# Patient Record
Sex: Female | Born: 1960 | Race: Black or African American | Hispanic: No | Marital: Married | Smoking: Never smoker
Health system: Southern US, Community
[De-identification: ages and names within clinical notes are randomized; demographics above are authoritative.]

## PROBLEM LIST (undated history)

## (undated) DIAGNOSIS — R102 Pelvic and perineal pain unspecified side: Secondary | ICD-10-CM

## (undated) DIAGNOSIS — B3731 Acute candidiasis of vulva and vagina: Secondary | ICD-10-CM

## (undated) DIAGNOSIS — R9230 Dense breasts, unspecified: Secondary | ICD-10-CM

## (undated) DIAGNOSIS — E079 Disorder of thyroid, unspecified: Secondary | ICD-10-CM

## (undated) DIAGNOSIS — B373 Candidiasis of vulva and vagina: Secondary | ICD-10-CM

## (undated) DIAGNOSIS — I10 Essential (primary) hypertension: Secondary | ICD-10-CM

## (undated) DIAGNOSIS — R922 Inconclusive mammogram: Secondary | ICD-10-CM

## (undated) HISTORY — DX: Dense breasts, unspecified: R92.30

## (undated) HISTORY — DX: Candidiasis of vulva and vagina: B37.3

## (undated) HISTORY — DX: Pelvic and perineal pain: R10.2

## (undated) HISTORY — PX: ABDOMINAL HYSTERECTOMY: SHX81

## (undated) HISTORY — DX: Inconclusive mammogram: R92.2

## (undated) HISTORY — DX: Pelvic and perineal pain unspecified side: R10.20

## (undated) HISTORY — DX: Essential (primary) hypertension: I10

## (undated) HISTORY — DX: Acute candidiasis of vulva and vagina: B37.31

## (undated) HISTORY — DX: Disorder of thyroid, unspecified: E07.9

---

## 2005-09-11 ENCOUNTER — Encounter: Admission: RE | Admit: 2005-09-11 | Discharge: 2005-09-11 | Payer: Self-pay | Admitting: Endocrinology

## 2005-10-03 ENCOUNTER — Encounter: Admission: RE | Admit: 2005-10-03 | Discharge: 2005-10-03 | Payer: Self-pay | Admitting: Endocrinology

## 2006-04-20 ENCOUNTER — Other Ambulatory Visit: Admission: RE | Admit: 2006-04-20 | Discharge: 2006-04-20 | Payer: Self-pay | Admitting: Obstetrics and Gynecology

## 2006-05-14 ENCOUNTER — Encounter: Admission: RE | Admit: 2006-05-14 | Discharge: 2006-05-14 | Payer: Self-pay | Admitting: Obstetrics and Gynecology

## 2006-05-29 ENCOUNTER — Encounter: Admission: RE | Admit: 2006-05-29 | Discharge: 2006-05-29 | Payer: Self-pay | Admitting: Obstetrics and Gynecology

## 2006-12-29 ENCOUNTER — Encounter: Admission: RE | Admit: 2006-12-29 | Discharge: 2006-12-29 | Payer: Self-pay | Admitting: Obstetrics and Gynecology

## 2007-05-19 ENCOUNTER — Encounter: Admission: RE | Admit: 2007-05-19 | Discharge: 2007-05-19 | Payer: Self-pay | Admitting: Obstetrics and Gynecology

## 2007-05-31 ENCOUNTER — Ambulatory Visit (HOSPITAL_COMMUNITY): Admission: RE | Admit: 2007-05-31 | Discharge: 2007-05-31 | Payer: Self-pay | Admitting: Obstetrics and Gynecology

## 2009-08-08 IMAGING — CT CT PELVIS W/ CM
3 of 5 series · 15 of 32 positions shown, 19 images · IV contrast (omnipaque)
Comparison: none

CLINICAL DATA: Left lower quadrant pain for several years.  Uterus and left ovary removed. 
 ABDOMEN CT WITH CONTRAST:
TECHNIQUE: Multidetector CT imaging of the abdomen was performed following the standard protocol during bolus administration of intravenous contrast.
 Contrast:  20   cc Omnipaque 300
TECHNIQUE: Multidetector CT imaging of the pelvis was performed following the standard protocol during bolus administration of intravenous contrast.

[Series 2: abd pelvis · axial · 0.67mm/px · z∈[-282,-102]mm · 3 of 72 slices shown, 7 images]
[im 18/72  soft-tissue]
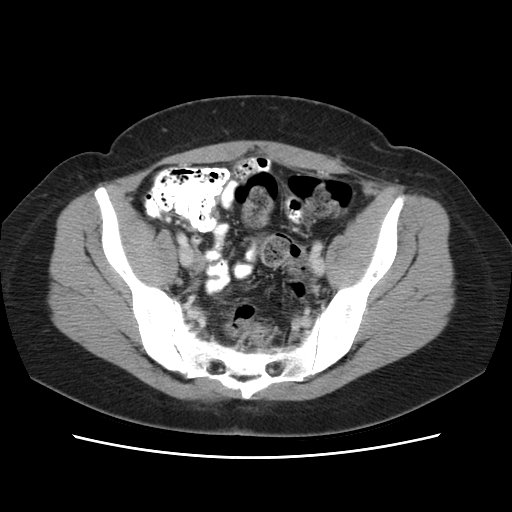
[im 18/72  lung]
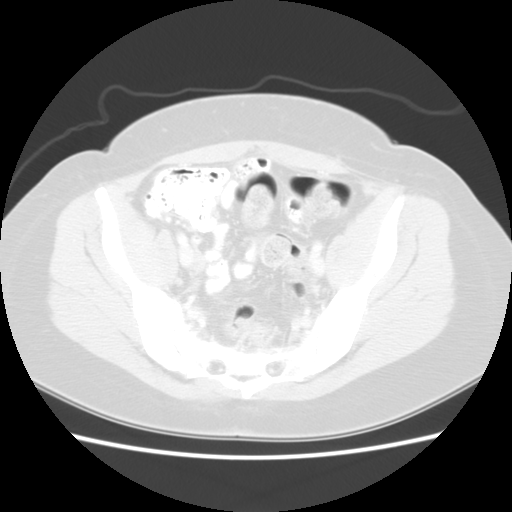
[im 18/72  bone]
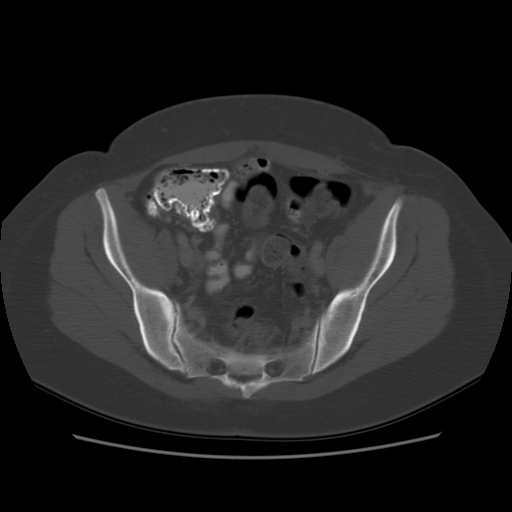
[im 36/72  soft-tissue]
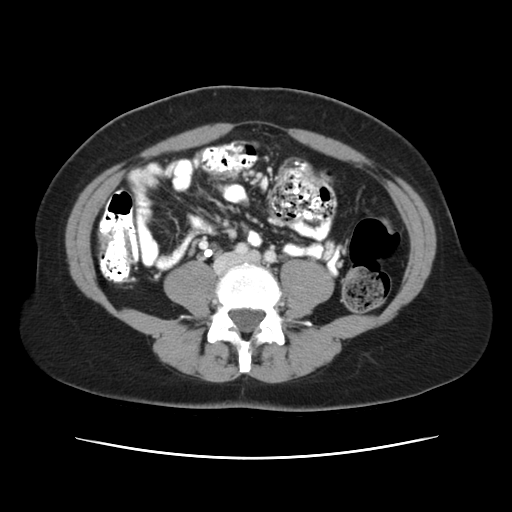
[im 36/72  lung]
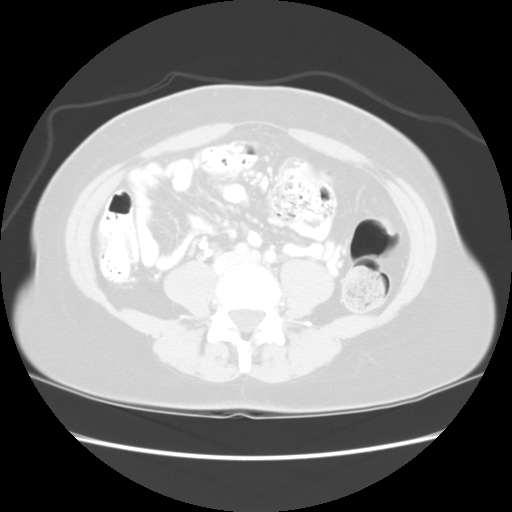
[im 54/72  soft-tissue]
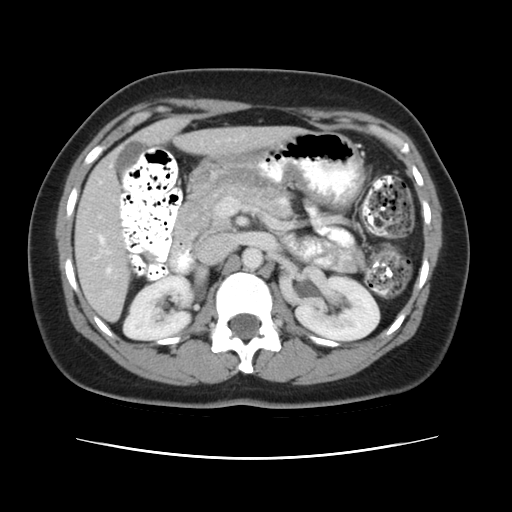
[im 54/72  lung]
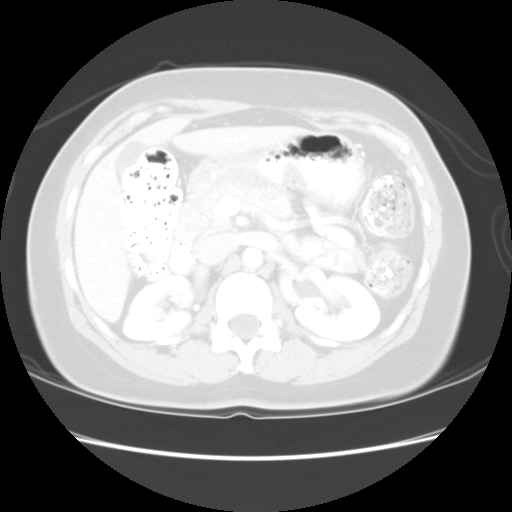

[Series 400: reformatted · coronal · 0.85mm/px · 4 of 121 slices shown (1 of 2)]
[im 14/121  soft-tissue]
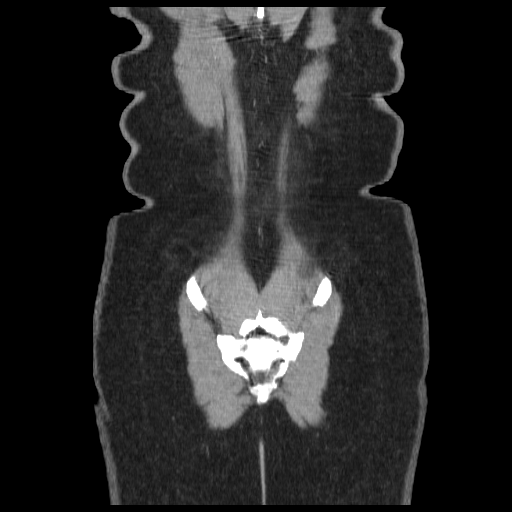
[im 27/121  soft-tissue]
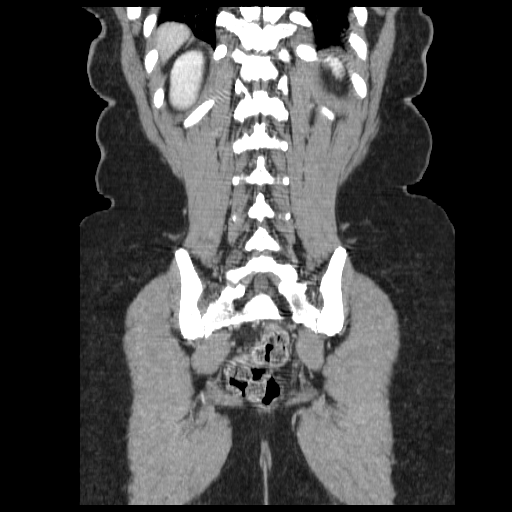
[im 41/121  soft-tissue]
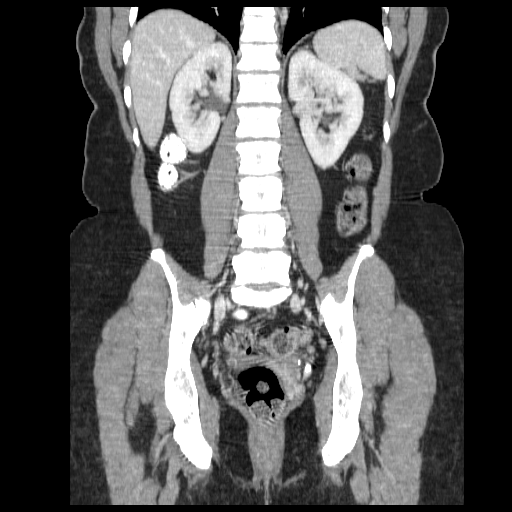
[im 54/121  soft-tissue]
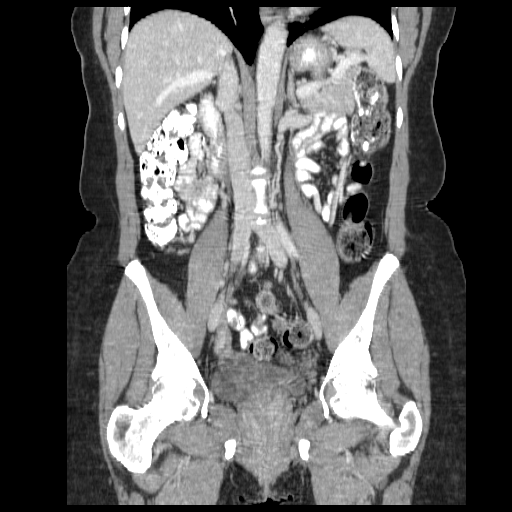

[Series 401: reformatted · sagittal · 0.85mm/px · 8 of 130 slices shown (2 of 2)]
[im 13/130  soft-tissue]
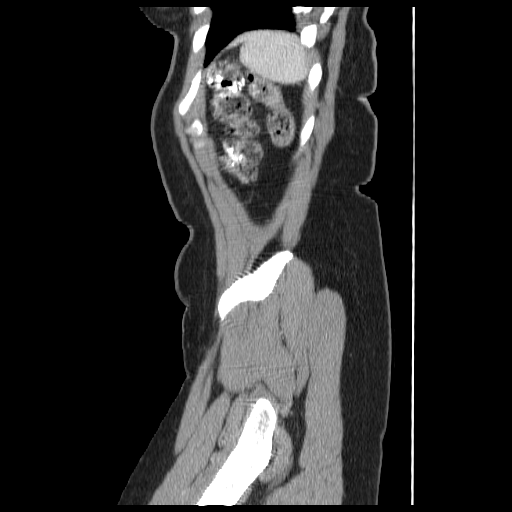
[im 26/130  soft-tissue]
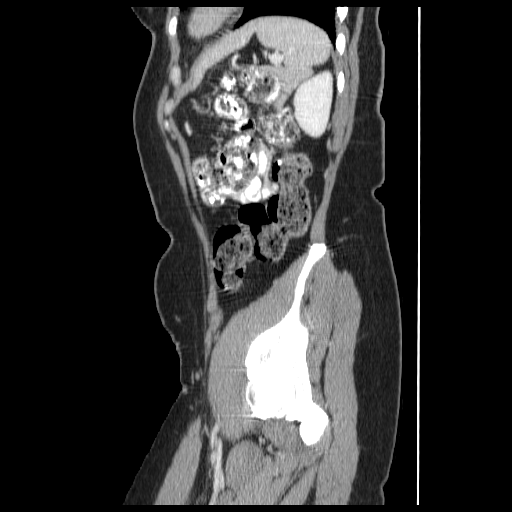
[im 39/130  soft-tissue]
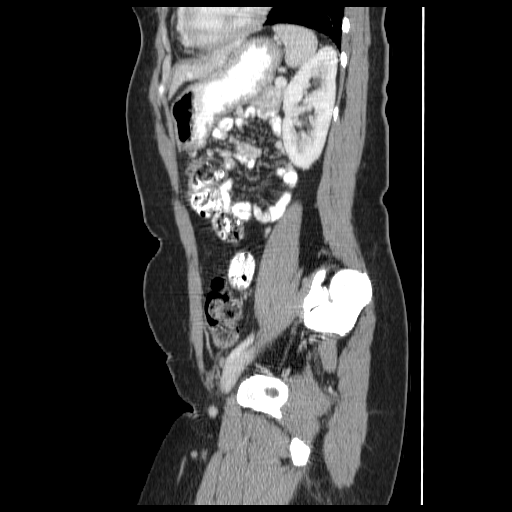
[im 52/130  soft-tissue]
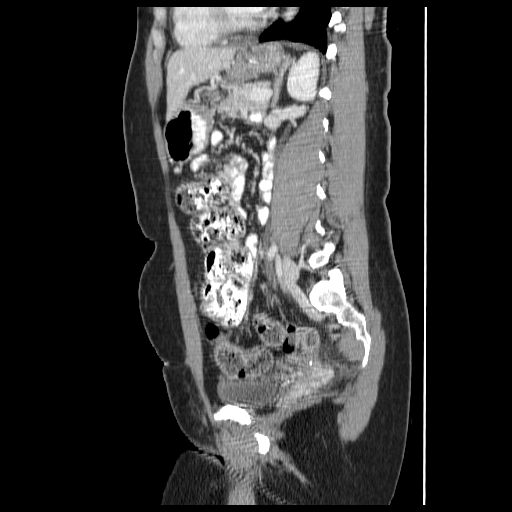
[im 78/130  soft-tissue]
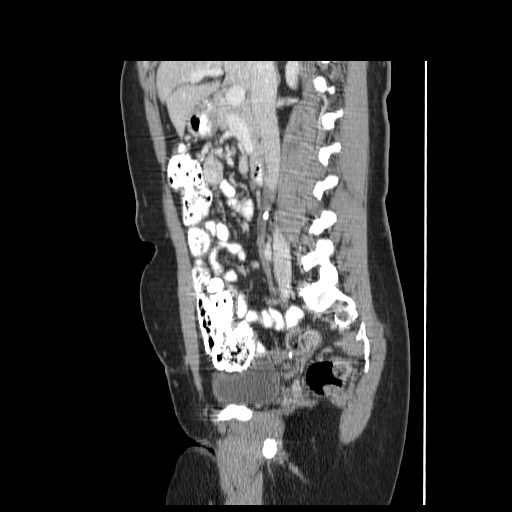
[im 91/130  soft-tissue]
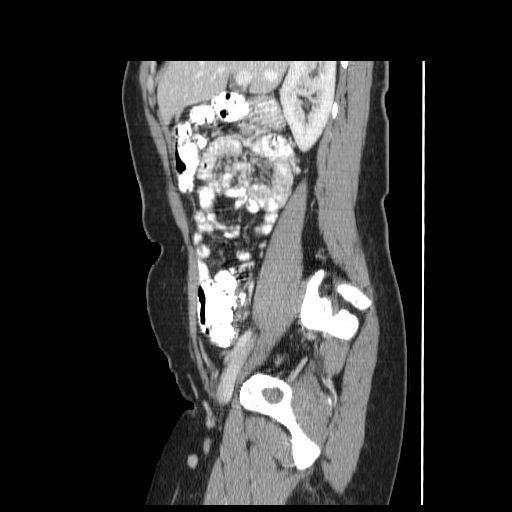
[im 104/130  soft-tissue]
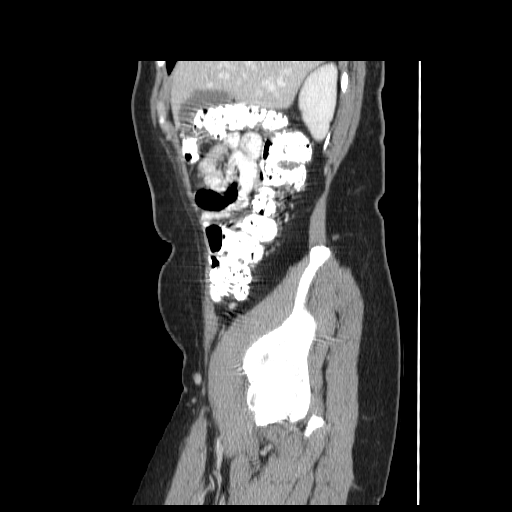
[im 117/130  soft-tissue]
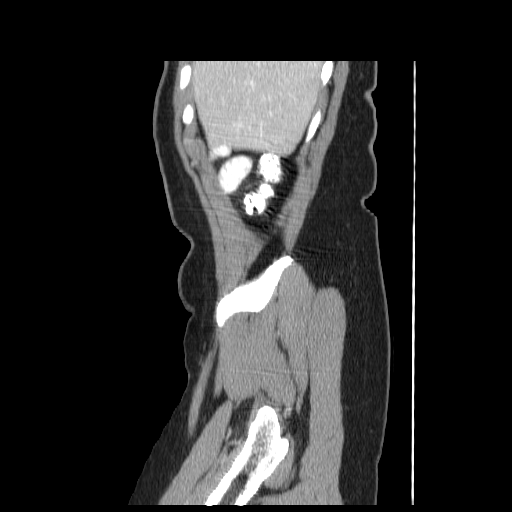

[15 of 32 positions shown; findings below may reference images not displayed]

FINDINGS: The liver, spleen, pancreas, adrenal glands, and kidneys are normal.  No dilated loops of large or small bowel.  Terminal ileum and appendix appear normal.  The appendix does extend to cross the midline anterior to the L5 vertebral body.
IMPRESSION: Normal CT scan of the abdomen. 
 PELVIS CT WITH CONTRAST:
FINDINGS: The uterus and left ovary have been removed.  Right ovary is visible and appears normal.  No diverticular disease, mass lesion, free fluid, or other significant abnormality.  Delayed imaging demonstrates that the distal ureters and bladder appear normal.  
 There are multiple phleboliths in the pelvis.
IMPRESSION: No significant abnormality of the pelvis.

## 2010-01-24 ENCOUNTER — Encounter: Admission: RE | Admit: 2010-01-24 | Discharge: 2010-01-24 | Payer: Self-pay | Admitting: Internal Medicine

## 2011-04-04 ENCOUNTER — Other Ambulatory Visit: Payer: Self-pay | Admitting: Obstetrics and Gynecology

## 2011-04-04 DIAGNOSIS — Z1231 Encounter for screening mammogram for malignant neoplasm of breast: Secondary | ICD-10-CM

## 2011-04-16 ENCOUNTER — Ambulatory Visit
Admission: RE | Admit: 2011-04-16 | Discharge: 2011-04-16 | Disposition: A | Discharge status: Home / Self Care | Payer: BC Managed Care – PPO | Source: Ambulatory Visit | Attending: Obstetrics and Gynecology | Admitting: Obstetrics and Gynecology

## 2011-04-16 DIAGNOSIS — Z1231 Encounter for screening mammogram for malignant neoplasm of breast: Secondary | ICD-10-CM

## 2012-03-16 ENCOUNTER — Other Ambulatory Visit: Payer: Self-pay | Admitting: Internal Medicine

## 2012-03-16 DIAGNOSIS — Z1231 Encounter for screening mammogram for malignant neoplasm of breast: Secondary | ICD-10-CM

## 2012-04-03 IMAGING — CR DG CHEST 2V
2 series · 2 of 2 positions shown · non-contrast
Comparison: none

CLINICAL DATA: Sternal chest pain without injury.  Normal cardiac
stress test.

CHEST - 2 VIEW

[w chest pa]
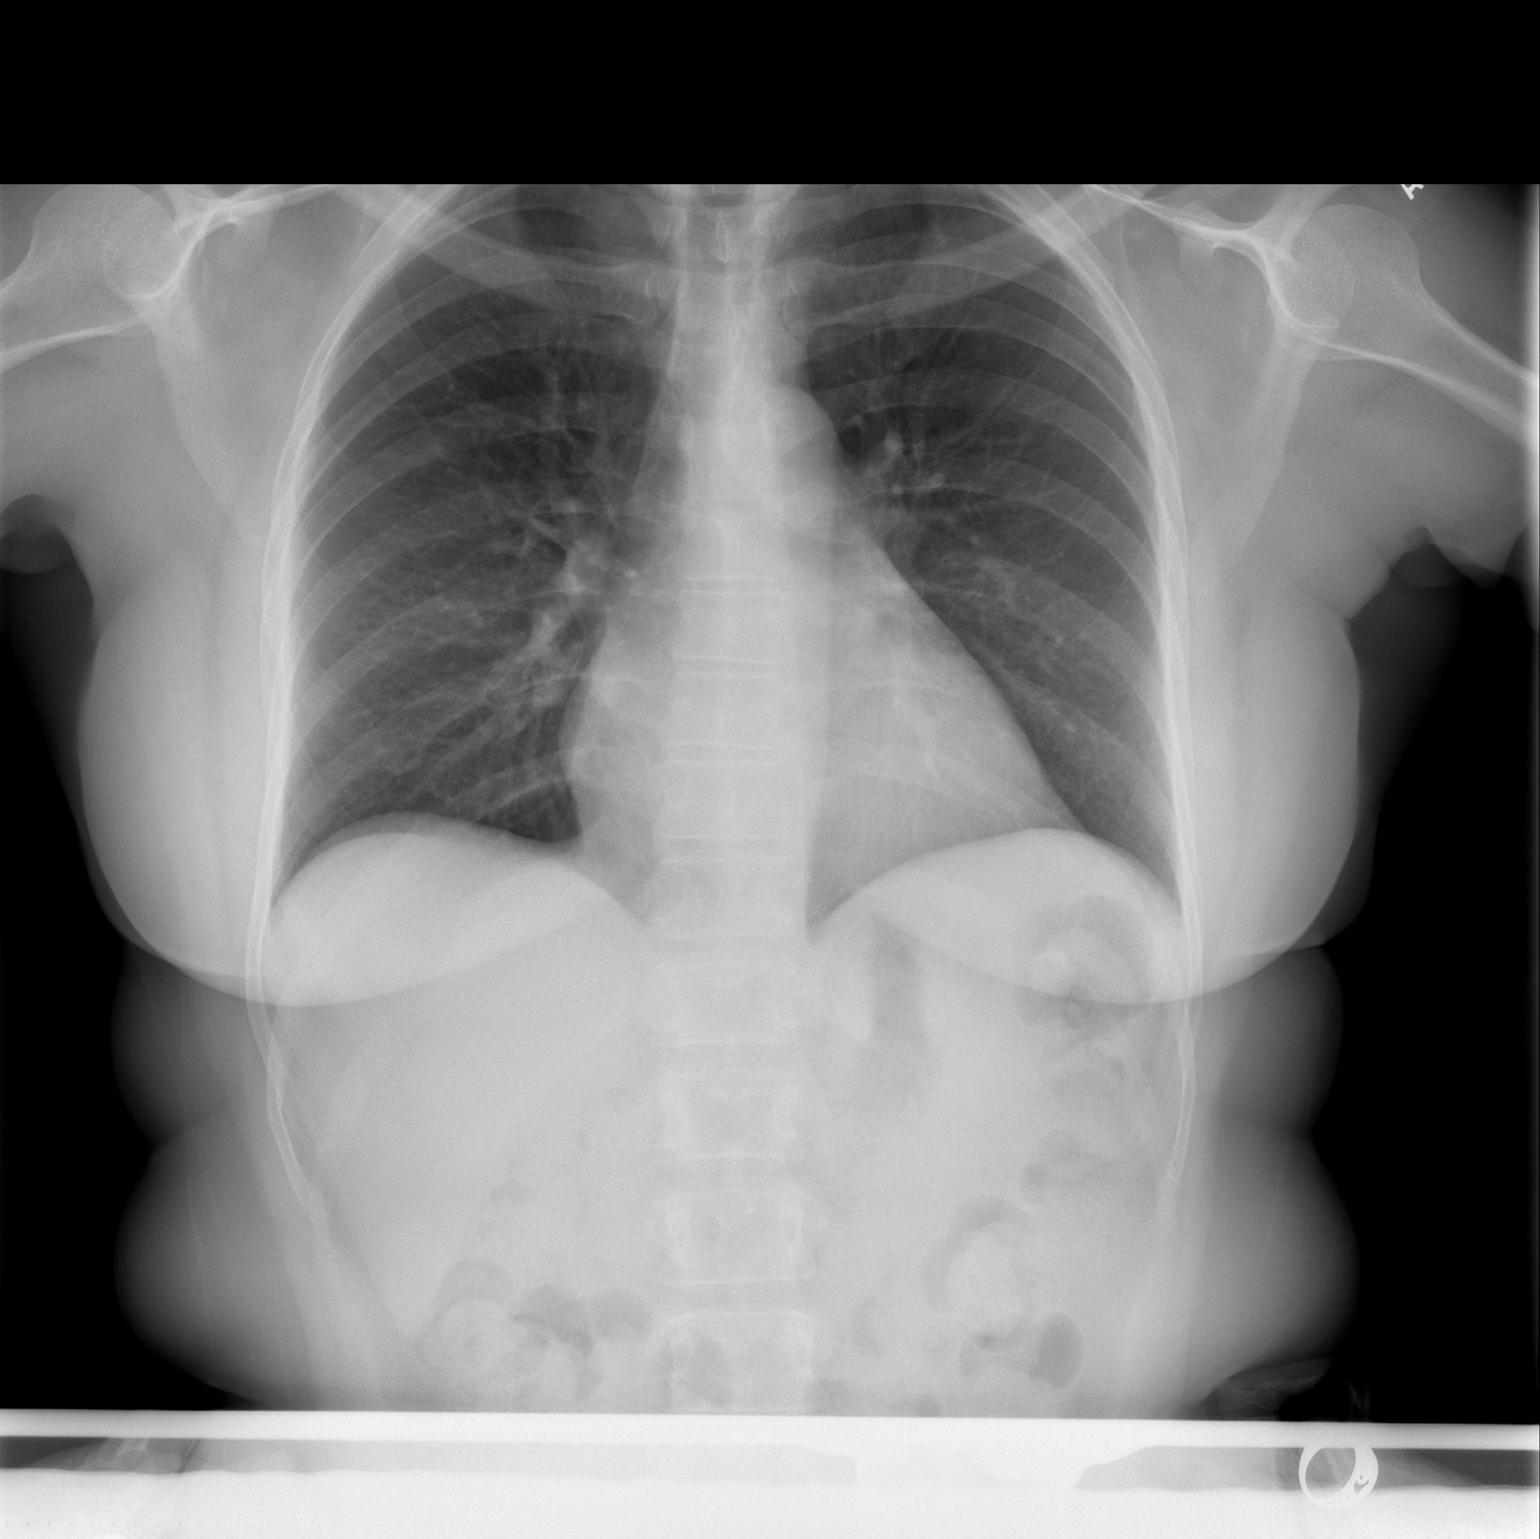

[w chest lat]
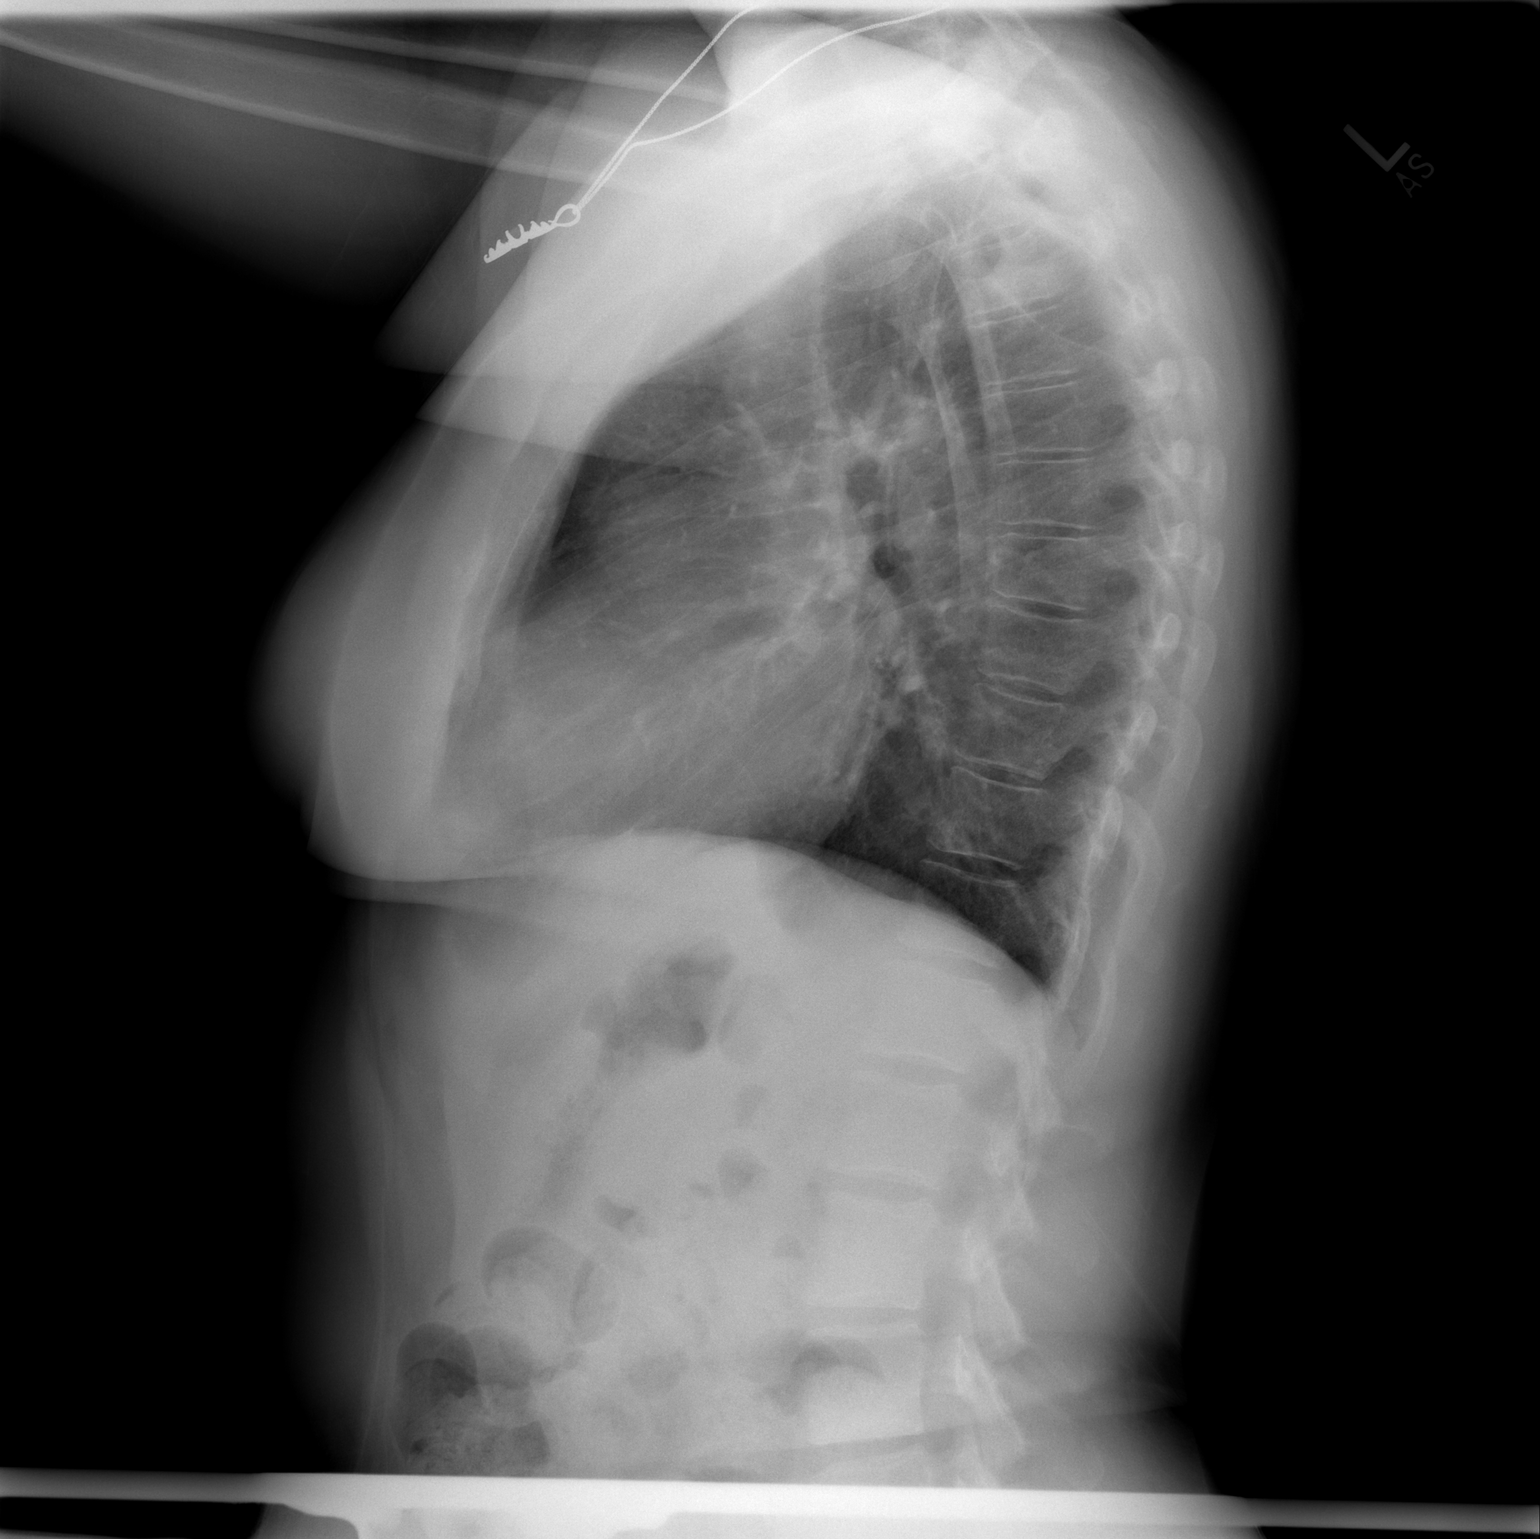

[2 of 2 positions shown; findings below may reference images not displayed]

FINDINGS: Lungs are clear.  Heart size normal.  Mediastinum, hila,
pleura and osseous structures appear normal for age.
IMPRESSION: Normal.

## 2012-04-16 ENCOUNTER — Ambulatory Visit: Payer: BC Managed Care – PPO

## 2012-04-16 ENCOUNTER — Ambulatory Visit
Admission: RE | Admit: 2012-04-16 | Discharge: 2012-04-16 | Disposition: A | Discharge status: Home / Self Care | Payer: BC Managed Care – PPO | Source: Ambulatory Visit | Attending: Internal Medicine | Admitting: Internal Medicine

## 2012-04-16 DIAGNOSIS — Z1231 Encounter for screening mammogram for malignant neoplasm of breast: Secondary | ICD-10-CM

## 2012-04-22 ENCOUNTER — Ambulatory Visit: Payer: Self-pay | Admitting: Obstetrics and Gynecology

## 2012-05-10 ENCOUNTER — Encounter: Payer: Self-pay | Admitting: Obstetrics and Gynecology

## 2012-05-10 ENCOUNTER — Ambulatory Visit (INDEPENDENT_AMBULATORY_CARE_PROVIDER_SITE_OTHER): Payer: BC Managed Care – PPO | Admitting: Obstetrics and Gynecology

## 2012-05-10 VITALS — BP 128/80 | HR 70 | Ht 64.5 in | Wt 162.0 lb

## 2012-05-10 DIAGNOSIS — R102 Pelvic and perineal pain: Secondary | ICD-10-CM

## 2012-05-10 DIAGNOSIS — Z0000 Encounter for general adult medical examination without abnormal findings: Secondary | ICD-10-CM

## 2012-05-10 DIAGNOSIS — K5900 Constipation, unspecified: Secondary | ICD-10-CM

## 2012-05-10 NOTE — Patient Instructions (Signed)
Exercise to Lose Weight Exercise and a healthy diet may help you lose weight. Your doctor may suggest specific exercises. EXERCISE IDEAS AND TIPS  Choose low-cost things you enjoy doing, such as walking, bicycling, or exercising to workout videos.  Take stairs instead of the elevator.  Walk during your lunch break.  Park your car further away from work or school.  Go to a gym or an exercise class.  Start with 5 to 10 minutes of exercise each day. Build up to 30 minutes of exercise 4 to 6 days a week.  Wear shoes with good support and comfortable clothes.  Stretch before and after working out.  Work out until you breathe harder and your heart beats faster.  Drink extra water when you exercise.  Do not do so much that you hurt yourself, feel dizzy, or get very short of breath. Exercises that burn about 150 calories:  Running 1  miles in 15 minutes.  Playing volleyball for 45 to 60 minutes.  Washing and waxing a car for 45 to 60 minutes.  Playing touch football for 45 minutes.  Walking 1  miles in 35 minutes.  Pushing a stroller 1  miles in 30 minutes.  Playing basketball for 30 minutes.  Raking leaves for 30 minutes.  Bicycling 5 miles in 30 minutes.  Walking 2 miles in 30 minutes.  Dancing for 30 minutes.  Shoveling snow for 15 minutes.  Swimming laps for 20 minutes.  Walking up stairs for 15 minutes.  Bicycling 4 miles in 15 minutes.  Gardening for 30 to 45 minutes.  Jumping rope for 15 minutes.  Washing windows or floors for 45 to 60 minutes. Document Released: 06/28/2010 Document Revised: 08/18/2011 Document Reviewed: 06/28/2010 [facility] Patient Information 2013 [place], [place]. Constipation, Adult Constipation is when a person has fewer than 3 bowel movements a week; has difficulty having a bowel movement; or has stools that are dry, hard, or larger than normal. As people grow older, constipation is more common. If you try to fix constipation  with medicines that make you have a bowel movement (laxatives), the problem may get worse. Long-term laxative use may cause the muscles of the colon to become weak. A low-fiber diet, not taking in enough fluids, and taking certain medicines may make constipation worse. CAUSES   Certain medicines, such as antidepressants, pain medicine, iron supplements, antacids, and water pills.   Certain diseases, such as diabetes, irritable bowel syndrome (IBS), thyroid disease, or depression.   Not drinking enough water.   Not eating enough fiber-rich foods.   Stress or travel.  Lack of physical activity or exercise.  Not going to the restroom when there is the urge to have a bowel movement.  Ignoring the urge to have a bowel movement.  Using laxatives too much. SYMPTOMS   Having fewer than 3 bowel movements a week.   Straining to have a bowel movement.   Having hard, dry, or larger than normal stools.   Feeling full or bloated.   Pain in the lower abdomen.  Not feeling relief after having a bowel movement. DIAGNOSIS  Your caregiver will take a medical history and perform a physical exam. Further testing may be done for severe constipation. Some tests may include:   A barium enema X-ray to examine your rectum, colon, and sometimes, your small intestine.  A sigmoidoscopy to examine your lower colon.  A colonoscopy to examine your entire colon. TREATMENT  Treatment will depend on the severity of your constipation and  what is causing it. Some dietary treatments include drinking more fluids and eating more fiber-rich foods. Lifestyle treatments may include regular exercise. If these diet and lifestyle recommendations do not help, your caregiver may recommend taking over-the-counter laxative medicines to help you have bowel movements. Prescription medicines may be prescribed if over-the-counter medicines do not work.  HOME CARE INSTRUCTIONS   Increase dietary fiber in your diet,  such as fruits, vegetables, whole grains, and beans. Limit high-fat and processed sugars in your diet, such as [place] fries, hamburgers, cookies, candies, and soda.   A fiber supplement may be added to your diet if you cannot get enough fiber from foods.   Drink enough fluids to keep your urine clear or pale yellow.   Exercise regularly or as directed by your caregiver.   Go to the restroom when you have the urge to go. Do not hold it.  Only take medicines as directed by your caregiver. Do not take other medicines for constipation without talking to your caregiver first. SEEK IMMEDIATE MEDICAL CARE IF:   You have bright red blood in your stool.   Your constipation lasts for more than 4 days or gets worse.   You have abdominal or rectal pain.   You have thin, pencil-like stools.  You have unexplained weight loss. MAKE SURE YOU:   Understand these instructions.  Will watch your condition.  Will get help right away if you are not doing well or get worse. Document Released: 02/22/2004 Document Revised: 08/18/2011 Document Reviewed: 04/29/2011 [facility] Patient Information 2013 [place], [place].

## 2012-05-10 NOTE — Progress Notes (Signed)
Last Pap: 04/03/11 WNL: Yes Regular Periods:no Contraception: hysterectomy   Monthly Breast exam:yes Tetanus<22yrs:no Nl.Bladder Function:yes Daily BMs:no Healthy Diet:no Calcium:no Mammogram:yes Date of Mammogram: 04/16/12 Exercise:yes Have often Exercise: once per week  Seatbelt: yes Abuse at home: no Stressful work:no Sigmoid-colonoscopy: no Bone Density: No PCP: Dr. Dorothyann Peng  Change in PMH: none  Change in [facility]: none BP 128/80  Pulse 70  Ht 5' 4.5" (1.638 m)  Wt 162 lb (73.483 kg)  BMI 27.38 kg/m2 Pt with complaints:yes and she still has some mild pelvic left sided pain.  It worsens after a BM.  She only has a BM 2-3 times a week.  She drinks two bottles of water a day.  No blood in her stool she exercises occ Physical Examination: General appearance - alert, well appearing, and in no distress Mental status - normal mood, behavior, speech, dress, motor activity, and thought processes Neck - supple, no significant adenopathy,  thyroid exam: thyroid is normal in size without nodules or tenderness Chest - clear to auscultation, no wheezes, rales or rhonchi, symmetric air entry Heart - normal rate and regular rhythm Abdomen - soft, nontender, nondistended, no masses or organomegaly Breasts - breasts appear normal, no suspicious masses, no skin or nipple changes or axillary nodes Pelvic - normal external genitalia, vulva, vagina, cervix, uterus and adnexa Rectal - not done Back exam - full range of motion, no tenderness, palpable spasm or pain on motion Neurological - alert, oriented, normal speech, no focal findings or movement disorder noted Musculoskeletal - no joint tenderness, deformity or swelling Extremities - no edema, redness or tenderness in the calves or thighs Skin - normal coloration and turgor, no rashes, no suspicious skin lesions noted Routine exam Constipation Pelvic pain Pap sent no Mammogram due no nothing used for contraception.  Pt had a  hysterectomy RT 4-6 weeks for [place] Increase water and fiber in diet.  Exercise regularly

## 2012-05-27 ENCOUNTER — Encounter: Payer: BC Managed Care – PPO | Admitting: Obstetrics and Gynecology

## 2012-05-27 ENCOUNTER — Other Ambulatory Visit: Payer: BC Managed Care – PPO

## 2012-06-25 ENCOUNTER — Encounter: Payer: BC Managed Care – PPO | Admitting: Obstetrics and Gynecology

## 2012-06-25 ENCOUNTER — Other Ambulatory Visit: Payer: BC Managed Care – PPO

## 2012-07-14 ENCOUNTER — Other Ambulatory Visit: Payer: Self-pay | Admitting: Obstetrics and Gynecology

## 2012-07-14 ENCOUNTER — Encounter: Payer: BC Managed Care – PPO | Admitting: Obstetrics and Gynecology

## 2012-07-14 ENCOUNTER — Other Ambulatory Visit: Payer: BC Managed Care – PPO

## 2012-07-14 DIAGNOSIS — R102 Pelvic and perineal pain: Secondary | ICD-10-CM

## 2012-08-04 ENCOUNTER — Other Ambulatory Visit: Payer: BC Managed Care – PPO

## 2012-08-04 ENCOUNTER — Encounter: Payer: BC Managed Care – PPO | Admitting: Obstetrics and Gynecology

## 2012-10-11 ENCOUNTER — Other Ambulatory Visit: Payer: Self-pay | Admitting: Internal Medicine

## 2012-10-11 DIAGNOSIS — Z1231 Encounter for screening mammogram for malignant neoplasm of breast: Secondary | ICD-10-CM

## 2013-04-18 ENCOUNTER — Ambulatory Visit: Payer: BC Managed Care – PPO

## 2013-05-20 ENCOUNTER — Ambulatory Visit
Admission: RE | Admit: 2013-05-20 | Discharge: 2013-05-20 | Disposition: A | Discharge status: Home / Self Care | Payer: BC Managed Care – PPO | Source: Ambulatory Visit | Attending: Internal Medicine | Admitting: Internal Medicine

## 2013-05-20 DIAGNOSIS — Z1231 Encounter for screening mammogram for malignant neoplasm of breast: Secondary | ICD-10-CM

## 2014-04-19 LAB — HM COLONOSCOPY

## 2016-08-25 DIAGNOSIS — L668 Other cicatricial alopecia: Secondary | ICD-10-CM | POA: Insufficient documentation

## 2016-08-25 DIAGNOSIS — L299 Pruritus, unspecified: Secondary | ICD-10-CM | POA: Insufficient documentation

## 2017-03-09 DIAGNOSIS — L089 Local infection of the skin and subcutaneous tissue, unspecified: Secondary | ICD-10-CM | POA: Insufficient documentation

## 2017-07-31 ENCOUNTER — Ambulatory Visit: Payer: Self-pay | Admitting: Physician Assistant

## 2017-10-21 ENCOUNTER — Other Ambulatory Visit: Payer: Self-pay | Admitting: Family Medicine

## 2018-05-14 ENCOUNTER — Encounter: Payer: Self-pay | Admitting: Internal Medicine

## 2018-05-14 ENCOUNTER — Ambulatory Visit: Payer: BC Managed Care – PPO | Admitting: Internal Medicine

## 2018-05-14 VITALS — Temp 98.1°F | Ht 64.5 in | Wt 166.0 lb

## 2018-05-14 DIAGNOSIS — I10 Essential (primary) hypertension: Secondary | ICD-10-CM | POA: Diagnosis not present

## 2018-05-14 DIAGNOSIS — R42 Dizziness and giddiness: Secondary | ICD-10-CM | POA: Diagnosis not present

## 2018-05-14 DIAGNOSIS — Z6828 Body mass index (BMI) 28.0-28.9, adult: Secondary | ICD-10-CM

## 2018-05-14 DIAGNOSIS — E663 Overweight: Secondary | ICD-10-CM | POA: Diagnosis not present

## 2018-05-14 DIAGNOSIS — R5383 Other fatigue: Secondary | ICD-10-CM

## 2018-05-14 MED ORDER — FLUTICASONE PROPIONATE 50 MCG/ACT NA SUSP: 1.0000 | Freq: Every day | NASAL | 1 refills | Status: AC

## 2018-05-14 MED ORDER — AMLODIPINE BESYLATE 5 MG PO TABS: 5.0000 mg | ORAL_TABLET | Freq: Every day | ORAL | 1 refills | Status: DC

## 2018-05-14 NOTE — Patient Instructions (Signed)
DASH Eating Plan DASH stands for "Dietary Approaches to Stop Hypertension." The DASH eating plan is a healthy eating plan that has been shown to reduce high blood pressure (hypertension). It may also reduce your risk for type 2 diabetes, heart disease, and stroke. The DASH eating plan may also help with weight loss. What are tips for following this plan? General guidelines  Avoid eating more than 2,300 mg (milligrams) of salt (sodium) a day. If you have hypertension, you may need to reduce your sodium intake to 1,500 mg a day.  Limit alcohol intake to no more than 1 drink a day for nonpregnant women and 2 drinks a day for men. One drink equals 12 oz of beer, 5 oz of wine, or 1 oz of hard liquor.  Work with your health care provider to maintain a healthy body weight or to lose weight. Ask what an ideal weight is for you.  Get at least 30 minutes of exercise that causes your heart to beat faster (aerobic exercise) most days of the week. Activities may include walking, swimming, or biking.  Work with your health care provider or diet and nutrition specialist (dietitian) to adjust your eating plan to your individual calorie needs. Reading food labels  Check food labels for the amount of sodium per serving. Choose foods with less than 5 percent of the Daily Value of sodium. Generally, foods with less than 300 mg of sodium per serving fit into this eating plan.  To find whole grains, look for the word "whole" as the first word in the ingredient list. Shopping  Buy products labeled as "low-sodium" or "no salt added."  Buy fresh foods. Avoid canned foods and premade or frozen meals. Cooking  Avoid adding salt when cooking. Use salt-free seasonings or herbs instead of table salt or sea salt. Check with your health care provider or pharmacist before using salt substitutes.  Do not fry foods. Cook foods using healthy methods such as baking, boiling, grilling, and broiling instead.  Cook with  heart-healthy oils, such as olive, canola, soybean, or sunflower oil. Meal planning   Eat a balanced diet that includes: ? 5 or more servings of fruits and vegetables each day. At each meal, try to fill half of your plate with fruits and vegetables. ? Up to 6-8 servings of whole grains each day. ? Less than 6 oz of lean meat, poultry, or fish each day. A 3-oz serving of meat is about the same size as a deck of cards. One egg equals 1 oz. ? 2 servings of low-fat dairy each day. ? A serving of nuts, seeds, or beans 5 times each week. ? Heart-healthy fats. Healthy fats called Omega-3 fatty acids are found in foods such as flaxseeds and coldwater fish, like sardines, salmon, and mackerel.  Limit how much you eat of the following: ? Canned or prepackaged foods. ? Food that is high in trans fat, such as fried foods. ? Food that is high in saturated fat, such as fatty meat. ? Sweets, desserts, sugary drinks, and other foods with added sugar. ? Full-fat dairy products.  Do not salt foods before eating.  Try to eat at least 2 vegetarian meals each week.  Eat more home-cooked food and less restaurant, buffet, and fast food.  When eating at a restaurant, ask that your food be prepared with less salt or no salt, if possible. What foods are recommended? The items listed may not be a complete list. Talk with your dietitian about what   dietary choices are best for you. Grains Whole-grain or whole-wheat bread. Whole-grain or whole-wheat pasta. Brown rice. Oatmeal. Quinoa. Bulgur. Whole-grain and low-sodium cereals. Pita bread. Low-fat, low-sodium crackers. Whole-wheat flour tortillas. Vegetables Fresh or frozen vegetables (raw, steamed, roasted, or grilled). Low-sodium or reduced-sodium tomato and vegetable juice. Low-sodium or reduced-sodium tomato sauce and tomato paste. Low-sodium or reduced-sodium canned vegetables. Fruits All fresh, dried, or frozen fruit. Canned fruit in natural juice (without  added sugar). Meat and other protein foods Skinless chicken or turkey. Ground chicken or turkey. Pork with fat trimmed off. Fish and seafood. Egg whites. Dried beans, peas, or lentils. Unsalted nuts, nut butters, and seeds. Unsalted canned beans. Lean cuts of beef with fat trimmed off. Low-sodium, lean deli meat. Dairy Low-fat (1%) or fat-free (skim) milk. Fat-free, low-fat, or reduced-fat cheeses. Nonfat, low-sodium ricotta or cottage cheese. Low-fat or nonfat yogurt. Low-fat, low-sodium cheese. Fats and oils Soft margarine without trans fats. Vegetable oil. Low-fat, reduced-fat, or light mayonnaise and salad dressings (reduced-sodium). Canola, safflower, olive, soybean, and sunflower oils. Avocado. Seasoning and other foods Herbs. Spices. Seasoning mixes without salt. Unsalted popcorn and pretzels. Fat-free sweets. What foods are not recommended? The items listed may not be a complete list. Talk with your dietitian about what dietary choices are best for you. Grains Baked goods made with fat, such as croissants, muffins, or some breads. Dry pasta or rice meal packs. Vegetables Creamed or fried vegetables. Vegetables in a cheese sauce. Regular canned vegetables (not low-sodium or reduced-sodium). Regular canned tomato sauce and paste (not low-sodium or reduced-sodium). Regular tomato and vegetable juice (not low-sodium or reduced-sodium). Pickles. Olives. Fruits Canned fruit in a light or heavy syrup. Fried fruit. Fruit in cream or butter sauce. Meat and other protein foods Fatty cuts of meat. Ribs. Fried meat. Bacon. Sausage. Bologna and other processed lunch meats. Salami. Fatback. Hotdogs. Bratwurst. Salted nuts and seeds. Canned beans with added salt. Canned or smoked fish. Whole eggs or egg yolks. Chicken or turkey with skin. Dairy Whole or 2% milk, cream, and half-and-half. Whole or full-fat cream cheese. Whole-fat or sweetened yogurt. Full-fat cheese. Nondairy creamers. Whipped toppings.  Processed cheese and cheese spreads. Fats and oils Butter. Stick margarine. Lard. Shortening. Ghee. Bacon fat. Tropical oils, such as coconut, palm kernel, or palm oil. Seasoning and other foods Salted popcorn and pretzels. Onion salt, garlic salt, seasoned salt, table salt, and sea salt. Worcestershire sauce. Tartar sauce. Barbecue sauce. Teriyaki sauce. Soy sauce, including reduced-sodium. Steak sauce. Canned and packaged gravies. Fish sauce. Oyster sauce. Cocktail sauce. Horseradish that you find on the shelf. Ketchup. Mustard. Meat flavorings and tenderizers. Bouillon cubes. Hot sauce and Tabasco sauce. Premade or packaged marinades. Premade or packaged taco seasonings. Relishes. Regular salad dressings. Where to find more information:  National Heart, Lung, and Blood Institute: www.nhlbi.nih.gov  American Heart Association: www.heart.org Summary  The DASH eating plan is a healthy eating plan that has been shown to reduce high blood pressure (hypertension). It may also reduce your risk for type 2 diabetes, heart disease, and stroke.  With the DASH eating plan, you should limit salt (sodium) intake to 2,300 mg a day. If you have hypertension, you may need to reduce your sodium intake to 1,500 mg a day.  When on the DASH eating plan, aim to eat more fresh fruits and vegetables, whole grains, lean proteins, low-fat dairy, and heart-healthy fats.  Work with your health care provider or diet and nutrition specialist (dietitian) to adjust your eating plan to your individual   calorie needs. This information is not intended to replace advice given to you by your health care provider. Make sure you discuss any questions you have with your health care provider. Document Released: 05/15/2011 Document Revised: 05/19/2016 Document Reviewed: 05/19/2016 Elsevier Interactive Patient Education  2018 [employer] Inc.  

## 2018-05-16 ENCOUNTER — Encounter: Payer: Self-pay | Admitting: Internal Medicine

## 2018-05-16 NOTE — Progress Notes (Addendum)
Subjective:     Patient ID: Kaitlyn Terry , female    DOB: 11/10/1960 , 57 y.o.   MRN: [id number]   Chief Complaint  Patient presents with  . dizzy    HPI  Dizziness  This is a recurrent problem. The current episode started 1 to 4 weeks ago. The problem occurs 2 to 4 times per day. The problem has been unchanged. Associated symptoms include fatigue. Pertinent negatives include no chest pain.   She reports her symptoms started the weekend of Thanksgiving. She reports she had several episodes of dizziness. She is not sure what may have triggered her sx. Her Uncle took her blood pressure and states it was elevated. She has no previous dx of htn. She denies headaches, chest pain and palpitations.   Past Medical History:  Diagnosis Date  . Breast density   . Pelvic pain   . Yeast vaginitis      Family History  Problem Relation Age of Onset  . COPD Mother   . Asthma Mother   . Cancer Father   . Allergies Father      Current Outpatient Medications:  .  amLODipine (NORVASC) 5 MG tablet, Take 1 tablet (5 mg total) by mouth daily., Disp: 30 tablet, Rfl: 1 .  fluticasone (FLONASE) 50 MCG/ACT nasal spray, Place 1 spray into both nostrils daily., Disp: 15.8 g, Rfl: 1 .  SYNTHROID 88 MCG tablet, TAKE 1 TABLET BY MOUTH MON - THUR AND 1/2 TAB ON FRI-SAT, Disp: , Rfl: 2   Allergies  Allergen Reactions  . Peanuts [Peanut Oil]      Review of Systems  Constitutional: Positive for fatigue.  Respiratory: Negative.   Cardiovascular: Negative.  Negative for chest pain.  Gastrointestinal: Negative.   Neurological: Positive for dizziness.  Psychiatric/Behavioral: Negative.      Today's Vitals   05/14/18 1550  Temp: 98.1 F (36.7 C)  TempSrc: Other (Comment)  Weight: 166 lb (75.3 kg)  Height: 5' 4.5" (1.638 m)  BP readings:     148/96 Pulse:     80  Body mass index is 28.05 kg/m.   Objective:  Physical Exam  Constitutional: She is oriented to person, place, and time. She  appears well-developed and well-nourished.  HENT:  Head: Normocephalic and atraumatic.  Eyes: EOM are normal.  Neck: Neck supple.  Cardiovascular: Normal rate, regular rhythm and normal heart sounds.  Pulmonary/Chest: Effort normal and breath sounds normal.  Neurological: She is alert and oriented to person, place, and time.  Skin: Skin is warm and dry.  Psychiatric: She has a normal mood and affect.  Nursing note and vitals reviewed.       Assessment And Plan:     1. Dizziness  Orthostatics performed. I think her sx are due to elevated blood pressure. She is encouraged to increase her water intake. She will let me know if her sx persist.   2. Essential hypertension, benign  Uncontrolled. I will start her on blood pressure medication. I will start amlodipine 5mg  nightly. She was advised of possible side effects including lightheadedness, dizziness, nausea, headaches and lower extremity edema. She will rto in four weeks for re-evaluation. All questions were answered to her satisfaction. She is also encouraged to avoid adding salt to her foods.   3. Body mass index (bmi) 28.0-28.9, adult  She is encouraged to strive to lose ten pounds over the next several weeks. She is encouraged to incorporate more exercise into her daily routine. She is advised  to aim for 30 minutes four to five days weekly.    4. Overweight   Gwynneth Alimentobyn N Aadit Hagood, MD

## 2018-05-25 ENCOUNTER — Other Ambulatory Visit: Payer: Self-pay | Admitting: Internal Medicine

## 2018-06-19 ENCOUNTER — Encounter: Payer: Self-pay | Admitting: Internal Medicine

## 2018-06-19 ENCOUNTER — Ambulatory Visit: Payer: BC Managed Care – PPO | Admitting: Internal Medicine

## 2018-06-19 VITALS — BP 124/86 | HR 76 | Temp 97.7°F | Ht 64.5 in | Wt 162.8 lb

## 2018-06-19 DIAGNOSIS — R7309 Other abnormal glucose: Secondary | ICD-10-CM

## 2018-06-19 DIAGNOSIS — E039 Hypothyroidism, unspecified: Secondary | ICD-10-CM

## 2018-06-19 DIAGNOSIS — Z6827 Body mass index (BMI) 27.0-27.9, adult: Secondary | ICD-10-CM

## 2018-06-19 DIAGNOSIS — I10 Essential (primary) hypertension: Secondary | ICD-10-CM

## 2018-06-19 DIAGNOSIS — E663 Overweight: Secondary | ICD-10-CM

## 2018-06-20 ENCOUNTER — Encounter: Payer: Self-pay | Admitting: Internal Medicine

## 2018-06-20 NOTE — Progress Notes (Signed)
Subjective:     Patient ID: Kaitlyn Terry , female    DOB: 08/05/1960 , 58 y.o.   MRN: [id number]   Chief Complaint  Patient presents with  . Hypertension    HPI  Hypertension  This is a chronic problem. The current episode started more than 1 month ago. The problem has been gradually improving since onset. The problem is controlled. Pertinent negatives include no blurred vision, chest pain, palpitations or shortness of breath. Risk factors for coronary artery disease include post-menopausal state.   She reports she has started to walk in her neighborhood. She has also made healthier food choices. She is no longer feeling dizzy. She has not had any issues with the amlodipine that was started at her last visit.   Past Medical History:  Diagnosis Date  . Breast density   . Pelvic pain   . Yeast vaginitis      Family History  Problem Relation Age of Onset  . COPD Mother   . Asthma Mother   . Cancer Father   . Allergies Father      Current Outpatient Medications:  .  amLODipine (NORVASC) 5 MG tablet, Take 1 tablet (5 mg total) by mouth daily., Disp: 30 tablet, Rfl: 1 .  fluticasone (FLONASE) 50 MCG/ACT nasal spray, Place 1 spray into both nostrils daily., Disp: 15.8 g, Rfl: 1 .  levothyroxine (SYNTHROID) 88 MCG tablet, Take 88 mcg by mouth daily before breakfast. Take 1 tablet by mouth Monday - Friday and 1/2 tablet on Saturday, Disp: , Rfl:    Allergies  Allergen Reactions  . Peanuts [Peanut Oil]      Review of Systems  Constitutional: Negative.   Eyes: Negative for blurred vision.  Respiratory: Negative.  Negative for shortness of breath.   Cardiovascular: Negative.  Negative for chest pain and palpitations.  Gastrointestinal: Negative.   Neurological: Negative.   Psychiatric/Behavioral: Negative.      Today's Vitals   06/19/18 0927  BP: 124/86  Pulse: 76  Temp: 97.7 F (36.5 C)  TempSrc: Oral  Weight: 162 lb 12.8 oz (73.8 kg)  Height: 5' 4.5" (1.638 m)    Body mass index is 27.51 kg/m.   Objective:  Physical Exam Vitals signs and nursing note reviewed.  Constitutional:      Appearance: Normal appearance.  HENT:     Head: Normocephalic and atraumatic.  Cardiovascular:     Rate and Rhythm: Normal rate and regular rhythm.     Heart sounds: Normal heart sounds.  Pulmonary:     Effort: Pulmonary effort is normal.     Breath sounds: Normal breath sounds.  Skin:    General: Skin is warm.  Neurological:     General: No focal deficit present.     Mental Status: She is alert.         Assessment And Plan:     1. Essential hypertension, benign  Improved control with use of amlodipine nightly. She was congratulated on her positive lifestyle changes and encouraged to keep up the great work. She will rto in 3 months for re-evaluation.   - HIV antibody (with reflex)  2. Primary hypothyroidism  I will check thyroid panel and adjust meds as needed.    - TSH - T4, Free  3. Other abnormal glucose  HER A1C HAS BEEN ELEVATED IN THE PAST. I WILL CHECK AN A1C, BMET TODAY. SHE WAS ENCOURAGED TO AVOID SUGARY BEVERAGES AND PROCESSED FOODS INCLUDNG BREADS, RICE AND PASTA. - Hemoglobin A1c -  BMP8+EGFR  4. Body mass index (bmi) 27.0-27.9, adult  She was congratulated on her 4 pound weight loss. She is encouraged to continue with her lifestyle changes. She has been successful in incorporating more exercise into her daily routine.   5. Overweight (BMI 25.0-29.9)   Maximino Greenland, MD

## 2018-06-21 LAB — HIV ANTIBODY (ROUTINE TESTING W REFLEX): HIV Screen 4th Generation wRfx: NONREACTIVE

## 2018-06-21 LAB — HEMOGLOBIN A1C
ESTIMATED AVERAGE GLUCOSE: 120 mg/dL
Hgb A1c MFr Bld: 5.8 % — ABNORMAL HIGH (ref 4.8–5.6)

## 2018-06-21 LAB — BMP8+EGFR
BUN / CREAT RATIO: 15 (ref 9–23)
BUN: 13 mg/dL (ref 6–24)
CALCIUM: 10.1 mg/dL (ref 8.7–10.2)
CHLORIDE: 100 mmol/L (ref 96–106)
CO2: 27 mmol/L (ref 20–29)
CREATININE: 0.84 mg/dL (ref 0.57–1.00)
GFR calc Af Amer: 90 mL/min/{1.73_m2} (ref >59)
GFR calc non Af Amer: 78 mL/min/{1.73_m2} (ref >59)
GLUCOSE: 81 mg/dL (ref 65–99)
Potassium: 4.3 mmol/L (ref 3.5–5.2)
Sodium: 140 mmol/L (ref 134–144)

## 2018-06-21 LAB — T4, FREE: Free T4: 1.35 ng/dL (ref 0.82–1.77)

## 2018-06-21 LAB — TSH: TSH: 1.71 u[IU]/mL (ref 0.450–4.500)

## 2018-06-21 NOTE — Progress Notes (Signed)
Your thyroid function is perfect. Continue with current supplementation. Your hba1c is 5.8, this is in predm range. Your kidney fxn is nl. You are HIV negative.

## 2018-07-07 ENCOUNTER — Other Ambulatory Visit: Payer: Self-pay | Admitting: Internal Medicine

## 2018-07-10 ENCOUNTER — Ambulatory Visit: Payer: Self-pay | Admitting: Internal Medicine

## 2018-08-04 ENCOUNTER — Other Ambulatory Visit: Payer: Self-pay | Admitting: Internal Medicine

## 2018-08-12 ENCOUNTER — Ambulatory Visit: Payer: BC Managed Care – PPO | Admitting: Internal Medicine

## 2018-08-12 ENCOUNTER — Encounter: Payer: Self-pay | Admitting: Internal Medicine

## 2018-08-12 VITALS — BP 114/70 | HR 85 | Temp 98.3°F | Ht 63.8 in | Wt 161.8 lb

## 2018-08-12 DIAGNOSIS — J320 Chronic maxillary sinusitis: Secondary | ICD-10-CM

## 2018-08-12 DIAGNOSIS — I10 Essential (primary) hypertension: Secondary | ICD-10-CM

## 2018-08-12 MED ORDER — TRIAMCINOLONE ACETONIDE 40 MG/ML IJ SUSP
40.0000 mg | Freq: Once | INTRAMUSCULAR | Status: AC
  Administered 2018-08-12: 40 mg via INTRAMUSCULAR

## 2018-08-15 NOTE — Progress Notes (Signed)
Subjective:     Patient ID: Kaitlyn Terry , female    DOB: 05/13/1961 , 58 y.o.   MRN: [id number]   Chief Complaint  Patient presents with  . Sinus infection    HPI  She is here today for further evaluation of nasal congestion. She wants to be sure she does not have a sinus infection. She admits that she often feels nasal congestion, sinus drainage. She denies fever/chills.     Past Medical History:  Diagnosis Date  . Breast density   . Pelvic pain   . Yeast vaginitis      Family History  Problem Relation Age of Onset  . COPD Mother   . Asthma Mother   . Cancer Father   . Allergies Father      Current Outpatient Medications:  .  amLODipine (NORVASC) 5 MG tablet, TAKE 1 TABLET BY MOUTH EVERY DAY, Disp: 90 tablet, Rfl: 1 .  fluticasone (FLONASE) 50 MCG/ACT nasal spray, Place 1 spray into both nostrils daily., Disp: 15.8 g, Rfl: 1 .  levothyroxine (SYNTHROID) 88 MCG tablet, Take 88 mcg by mouth daily before breakfast. Take 1 tablet by mouth Monday - Friday and 1/2 tablet on Saturday, Disp: , Rfl:  .  Vitamin D, Ergocalciferol, (DRISDOL) 1.25 MG (50000 UT) CAPS capsule, TAKE 1 CAPSULE BY ORAL ROUTE EVERY WEEK ON TUES/FRI, Disp: 8 capsule, Rfl: 2   Allergies  Allergen Reactions  . Peanuts [Peanut Oil]      Review of Systems  Constitutional: Negative.   HENT: Positive for congestion.        She also c/o tooth pain. No fever/chills. Blows out yellowish mucus.   Respiratory: Negative.   Cardiovascular: Negative.   Gastrointestinal: Negative.   Neurological: Negative.   Psychiatric/Behavioral: Negative.      Today's Vitals   08/12/18 1418  BP: 114/70  Pulse: 85  Temp: 98.3 F (36.8 C)  TempSrc: Oral  SpO2: 97%  Weight: 161 lb 12.8 oz (73.4 kg)  Height: 5' 3.8" (1.621 m)   Body mass index is 27.95 kg/m.   Objective:  Physical Exam Vitals signs and nursing note reviewed.  Constitutional:      Appearance: Normal appearance.  HENT:     Head: Normocephalic  and atraumatic.     Right Ear: Tympanic membrane, ear canal and external ear normal.     Left Ear: Tympanic membrane, ear canal and external ear normal.     Mouth/Throat:     Mouth: Mucous membranes are moist.     Pharynx: Oropharynx is clear. Posterior oropharyngeal erythema present.  Cardiovascular:     Rate and Rhythm: Normal rate and regular rhythm.     Heart sounds: Normal heart sounds.  Pulmonary:     Effort: Pulmonary effort is normal.     Breath sounds: Normal breath sounds.  Skin:    General: Skin is warm.  Neurological:     General: No focal deficit present.     Mental Status: She is alert.  Psychiatric:        Mood and Affect: Mood normal.        Behavior: Behavior normal.         Assessment And Plan:     1. Chronic maxillary sinusitis  She was given Kenalog, 40mg  IM x1. She is advised to use nasal spray daily and to take antihistamine daily. I will refer her to ENT if her symptoms persist. She is encouraged to call me tomorrow to let me know  how she feels. She is hesitant to take oral prednisone.   - triamcinolone acetonide (KENALOG-40) injection 40 mg  2. Essential hypertension, benign  Well controlled. She will continue with current meds. She is encouraged to avoid OTC decongestants if possible.   [name], MD

## 2018-09-28 ENCOUNTER — Ambulatory Visit: Payer: BC Managed Care – PPO | Admitting: Internal Medicine

## 2018-09-28 ENCOUNTER — Ambulatory Visit: Payer: Self-pay | Admitting: Internal Medicine

## 2018-09-28 ENCOUNTER — Ambulatory Visit: Payer: Self-pay | Admitting: Nurse Practitioner

## 2018-09-28 ENCOUNTER — Other Ambulatory Visit: Payer: Self-pay

## 2018-09-28 ENCOUNTER — Encounter: Payer: Self-pay | Admitting: Internal Medicine

## 2018-09-28 VITALS — BP 128/74 | HR 64 | Temp 98.2°F | Ht 63.8 in | Wt 158.8 lb

## 2018-09-28 DIAGNOSIS — E039 Hypothyroidism, unspecified: Secondary | ICD-10-CM | POA: Diagnosis not present

## 2018-09-28 DIAGNOSIS — I10 Essential (primary) hypertension: Secondary | ICD-10-CM

## 2018-09-28 NOTE — Progress Notes (Signed)
  Subjective:     Patient ID: Kaitlyn Terry , female    DOB: 1961-01-12 , 58 y.o.   MRN: [id number]   Chief Complaint  Patient presents with  . Hypothyroidism  . Hypertension    HPI  She is here today for f/u hypothyroidism. She is currently taking Synthroid M-F and 1/2 tab on Saturdays. She has not had any issues with the medication.   Hypertension  This is a chronic problem. The current episode started more than 1 year ago. The problem has been gradually improving since onset. The problem is controlled. Pertinent negatives include no blurred vision, chest pain, palpitations or shortness of breath.     Past Medical History:  Diagnosis Date  . Breast density   . Pelvic pain   . Yeast vaginitis      Family History  Problem Relation Age of Onset  . COPD Mother   . Asthma Mother   . Cancer Father   . Allergies Father      Current Outpatient Medications:  .  amLODipine (NORVASC) 5 MG tablet, TAKE 1 TABLET BY MOUTH EVERY DAY, Disp: 90 tablet, Rfl: 1 .  fluticasone (FLONASE) 50 MCG/ACT nasal spray, Place 1 spray into both nostrils daily., Disp: 15.8 g, Rfl: 1 .  levothyroxine (SYNTHROID) 88 MCG tablet, Take 88 mcg by mouth daily before breakfast. Take 1 tablet by mouth Monday - Friday and 1/2 tablet on Saturday, Disp: , Rfl:  .  Vitamin D, Ergocalciferol, (DRISDOL) 1.25 MG (50000 UT) CAPS capsule, TAKE 1 CAPSULE BY ORAL ROUTE EVERY WEEK ON TUES/FRI, Disp: 8 capsule, Rfl: 2   Allergies  Allergen Reactions  . Peanuts [Peanut Oil]      Review of Systems  Constitutional: Negative.   Eyes: Negative for blurred vision.  Respiratory: Negative.  Negative for shortness of breath.   Cardiovascular: Negative.  Negative for chest pain and palpitations.  Gastrointestinal: Negative.   Neurological: Negative.   Psychiatric/Behavioral: Negative.      Today's Vitals   09/28/18 0913  BP: 128/74  Pulse: 64  Temp: 98.2 F (36.8 C)  TempSrc: Oral  Weight: 158 lb 12.8 oz (72  kg)  Height: 5' 3.8" (1.621 m)   Body mass index is 27.43 kg/m.   Objective:  Physical Exam Vitals signs and nursing note reviewed.  Constitutional:      Appearance: Normal appearance.  HENT:     Head: Normocephalic and atraumatic.  Cardiovascular:     Rate and Rhythm: Normal rate and regular rhythm.     Heart sounds: Normal heart sounds.  Pulmonary:     Effort: Pulmonary effort is normal.     Breath sounds: Normal breath sounds.  Skin:    General: Skin is warm.  Neurological:     General: No focal deficit present.     Mental Status: She is alert.  Psychiatric:        Mood and Affect: Mood normal.        Behavior: Behavior normal.         Assessment And Plan:     1. Primary hypothyroidism  I will check thyroid panel and adjust meds as needed.  - TSH - T4, Free  2. Essential hypertension, benign  Well controlled. She will continue with current meds and return to office in Sept 2020 for her annual physical examination.   [name], MD    THE PATIENT IS ENCOURAGED TO PRACTICE SOCIAL DISTANCING DUE TO THE COVID-19 PANDEMIC.

## 2018-09-28 NOTE — Patient Instructions (Signed)
Hypothyroidism  Hypothyroidism is when the thyroid gland does not make enough of certain hormones (it is underactive). The thyroid gland is a small gland located in the lower front part of the neck, just in front of the windpipe (trachea). This gland makes hormones that help control how the body uses food for energy (metabolism) as well as how the heart and brain function. These hormones also play a role in keeping your bones strong. When the thyroid is underactive, it produces too little of the hormones thyroxine (T4) and triiodothyronine (T3). What are the causes? This condition may be caused by:  Hashimoto's disease. This is a disease in which the body's disease-fighting system (immune system) attacks the thyroid gland. This is the most common cause.  Viral infections.  Pregnancy.  Certain medicines.  Birth defects.  Past radiation treatments to the head or neck for cancer.  Past treatment with radioactive iodine.  Past exposure to radiation in the environment.  Past surgical removal of part or all of the thyroid.  Problems with a gland in the center of the brain (pituitary gland).  Lack of enough iodine in the diet. What increases the risk? You are more likely to develop this condition if:  You are female.  You have a family history of thyroid conditions.  You use a medicine called lithium.  You take medicines that affect the immune system (immunosuppressants). What are the signs or symptoms? Symptoms of this condition include:  Feeling as though you have no energy (lethargy).  Not being able to tolerate cold.  Weight gain that is not explained by a change in diet or exercise habits.  Lack of appetite.  Dry skin.  Coarse hair.  Menstrual irregularity.  Slowing of thought processes.  Constipation.  Sadness or depression. How is this diagnosed? This condition may be diagnosed based on:  Your symptoms, your medical history, and a physical exam.  Blood  tests. You may also have imaging tests, such as an ultrasound or MRI. How is this treated? This condition is treated with medicine that replaces the thyroid hormones that your body does not make. After you begin treatment, it may take several weeks for symptoms to go away. Follow these instructions at home:  Take over-the-counter and prescription medicines only as told by your health care provider.  If you start taking any new medicines, tell your health care provider.  Keep all follow-up visits as told by your health care provider. This is important. ? As your condition improves, your dosage of thyroid hormone medicine may change. ? You will need to have blood tests regularly so that your health care provider can monitor your condition. Contact a health care provider if:  Your symptoms do not get better with treatment.  You are taking thyroid replacement medicine and you: ? Sweat a lot. ? Have tremors. ? Feel anxious. ? Lose weight rapidly. ? Cannot tolerate heat. ? Have emotional swings. ? Have diarrhea. ? Feel weak. Get help right away if you have:  Chest pain.  An irregular heartbeat.  A rapid heartbeat.  Difficulty breathing. Summary  Hypothyroidism is when the thyroid gland does not make enough of certain hormones (it is underactive).  When the thyroid is underactive, it produces too little of the hormones thyroxine (T4) and triiodothyronine (T3).  The most common cause is Hashimoto's disease, a disease in which the body's disease-fighting system (immune system) attacks the thyroid gland. The condition can also be caused by viral infections, medicine, pregnancy, or past   radiation treatment to the head or neck.  Symptoms may include weight gain, dry skin, constipation, feeling as though you do not have energy, and not being able to tolerate cold.  This condition is treated with medicine to replace the thyroid hormones that your body does not make. This information  is not intended to replace advice given to you by your health care provider. Make sure you discuss any questions you have with your health care provider. Document Released: 05/26/2005 Document Revised: 05/06/2017 Document Reviewed: 05/06/2017 Elsevier Interactive Patient Education  2019 [employer]  

## 2018-09-29 LAB — TSH: TSH: 3.88 u[IU]/mL (ref 0.450–4.500)

## 2018-09-29 LAB — T4, FREE: Free T4: 1.13 ng/dL (ref 0.82–1.77)

## 2018-10-20 ENCOUNTER — Other Ambulatory Visit: Payer: Self-pay | Admitting: Internal Medicine

## 2019-01-02 ENCOUNTER — Other Ambulatory Visit: Payer: Self-pay | Admitting: Internal Medicine

## 2019-01-26 ENCOUNTER — Other Ambulatory Visit: Payer: Self-pay

## 2019-01-26 ENCOUNTER — Encounter: Payer: Self-pay | Admitting: Internal Medicine

## 2019-01-26 MED ORDER — LEVOTHYROXINE SODIUM 88 MCG PO TABS: ORAL_TABLET | ORAL | 1 refills | Status: DC

## 2019-02-19 ENCOUNTER — Encounter: Payer: Self-pay | Admitting: Internal Medicine

## 2019-03-08 ENCOUNTER — Other Ambulatory Visit: Payer: Self-pay

## 2019-03-08 ENCOUNTER — Encounter: Payer: Self-pay | Admitting: Internal Medicine

## 2019-03-08 ENCOUNTER — Ambulatory Visit: Payer: BC Managed Care – PPO | Admitting: Internal Medicine

## 2019-03-08 VITALS — BP 140/80 | HR 75 | Temp 98.3°F | Ht 64.4 in | Wt 162.6 lb

## 2019-03-08 DIAGNOSIS — Z0000 Encounter for general adult medical examination without abnormal findings: Secondary | ICD-10-CM

## 2019-03-08 DIAGNOSIS — M778 Other enthesopathies, not elsewhere classified: Secondary | ICD-10-CM | POA: Diagnosis not present

## 2019-03-08 DIAGNOSIS — I10 Essential (primary) hypertension: Secondary | ICD-10-CM

## 2019-03-08 LAB — POCT URINALYSIS DIPSTICK
Bilirubin, UA: NEGATIVE
Blood, UA: NEGATIVE
Glucose, UA: NEGATIVE
Ketones, UA: NEGATIVE
Leukocytes, UA: NEGATIVE
Nitrite, UA: NEGATIVE
Protein, UA: NEGATIVE
Spec Grav, UA: 1.01 (ref 1.010–1.025)
Urobilinogen, UA: 0.2 E.U./dL
pH, UA: 6.5 (ref 5.0–8.0)

## 2019-03-08 LAB — POCT UA - MICROALBUMIN
Albumin/Creatinine Ratio, Urine, POC: 30
Creatinine, POC: 10 mg/dL
Microalbumin Ur, POC: 10 mg/L

## 2019-03-08 NOTE — Patient Instructions (Signed)
Voltaren Gel - apply to affected area two to three times daily as needed.    Health Maintenance, Female Adopting a healthy lifestyle and getting preventive care are important in promoting health and wellness. Ask your health care provider about:  The right schedule for you to have regular tests and exams.  Things you can do on your own to prevent diseases and keep yourself healthy. What should I know about diet, weight, and exercise? Eat a healthy diet   Eat a diet that includes plenty of vegetables, fruits, low-fat dairy products, and lean protein.  Do not eat a lot of foods that are high in solid fats, added sugars, or sodium. Maintain a healthy weight Body mass index (BMI) is used to identify weight problems. It estimates body fat based on height and weight. Your health care provider can help determine your BMI and help you achieve or maintain a healthy weight. Get regular exercise Get regular exercise. This is one of the most important things you can do for your health. Most adults should:  Exercise for at least 150 minutes each week. The exercise should increase your heart rate and make you sweat (moderate-intensity exercise).  Do strengthening exercises at least twice a week. This is in addition to the moderate-intensity exercise.  Spend less time sitting. Even light physical activity can be beneficial. Watch cholesterol and blood lipids Have your blood tested for lipids and cholesterol at 57 years of age, then have this test every 5 years. Have your cholesterol levels checked more often if:  Your lipid or cholesterol levels are high.  You are older than 57 years of age.  You are at high risk for heart disease. What should I know about cancer screening? Depending on your health history and family history, you may need to have cancer screening at various ages. This may include screening for:  Breast cancer.  Cervical cancer.  Colorectal cancer.  Skin cancer.  Lung  cancer. What should I know about heart disease, diabetes, and high blood pressure? Blood pressure and heart disease  High blood pressure causes heart disease and increases the risk of stroke. This is more likely to develop in people who have high blood pressure readings, are of African descent, or are overweight.  Have your blood pressure checked: ? Every 3-5 years if you are 35-50 years of age. ? Every year if you are 70 years old or older. Diabetes Have regular diabetes screenings. This checks your fasting blood sugar level. Have the screening done:  Once every three years after age 9 if you are at a normal weight and have a low risk for diabetes.  More often and at a younger age if you are overweight or have a high risk for diabetes. What should I know about preventing infection? Hepatitis B If you have a higher risk for hepatitis B, you should be screened for this virus. Talk with your health care provider to find out if you are at risk for hepatitis B infection. Hepatitis C Testing is recommended for:  Everyone born from [birth year] through 1964.  Anyone with known risk factors for hepatitis C. Sexually transmitted infections (STIs)  Get screened for STIs, including gonorrhea and chlamydia, if: ? You are sexually active and are younger than 57 years of age. ? You are older than 57 years of age and your health care provider tells you that you are at risk for this type of infection. ? Your sexual activity has changed since you were last  screened, and you are at increased risk for chlamydia or gonorrhea. Ask your health care provider if you are at risk.  Ask your health care provider about whether you are at high risk for HIV. Your health care provider may recommend a prescription medicine to help prevent HIV infection. If you choose to take medicine to prevent HIV, you should first get tested for HIV. You should then be tested every 3 months for as long as you are taking the medicine.  Pregnancy  If you are about to stop having your period (premenopausal) and you may become pregnant, seek counseling before you get pregnant.  Take 400 to 800 micrograms (mcg) of folic acid every day if you become pregnant.  Ask for birth control (contraception) if you want to prevent pregnancy. Osteoporosis and menopause Osteoporosis is a disease in which the bones lose minerals and strength with aging. This can result in bone fractures. If you are 37 years old or older, or if you are at risk for osteoporosis and fractures, ask your health care provider if you should:  Be screened for bone loss.  Take a calcium or vitamin D supplement to lower your risk of fractures.  Be given hormone replacement therapy (HRT) to treat symptoms of menopause. Follow these instructions at home: Lifestyle  Do not use any products that contain nicotine or tobacco, such as cigarettes, e-cigarettes, and chewing tobacco. If you need help quitting, ask your health care provider.  Do not use street drugs.  Do not share needles.  Ask your health care provider for help if you need support or information about quitting drugs. Alcohol use  Do not drink alcohol if: ? Your health care provider tells you not to drink. ? You are pregnant, may be pregnant, or are planning to become pregnant.  If you drink alcohol: ? Limit how much you use to 0-1 drink a day. ? Limit intake if you are breastfeeding.  Be aware of how much alcohol is in your drink. In the [place] one drink equals one 12 oz bottle of beer (355 mL), one 5 oz glass of wine (148 mL), or one 1 oz glass of hard liquor (44 mL). General instructions  Schedule regular health, dental, and eye exams.  Stay current with your vaccines.  Tell your health care provider if: ? You often feel depressed. ? You have ever been abused or do not feel safe at home. Summary  Adopting a healthy lifestyle and getting preventive care are important in promoting health  and wellness.  Follow your health care provider's instructions about healthy diet, exercising, and getting tested or screened for diseases.  Follow your health care provider's instructions on monitoring your cholesterol and blood pressure. This information is not intended to replace advice given to you by your health care provider. Make sure you discuss any questions you have with your health care provider. Document Released: 12/09/2010 Document Revised: 05/19/2018 Document Reviewed: 05/19/2018 Elsevier Patient Education  2020 [employer].

## 2019-03-08 NOTE — Progress Notes (Signed)
Subjective:     Patient ID: Kaitlyn Terry , female    DOB: 01/11/1961 , 58 y.o.   MRN: [id number]   Chief Complaint  Patient presents with  . Annual Exam    HPI  She is here today for a full physical examination. She is followed by GYN for her pelvic exams. She has no specific concerns at this time.   Hypertension This is a chronic problem. The current episode started more than 1 year ago. The problem has been gradually improving since onset. The problem is controlled. Pertinent negatives include no blurred vision, chest pain, palpitations or shortness of breath. Risk factors for coronary artery disease include obesity and sedentary lifestyle. The current treatment provides moderate improvement. Compliance problems include exercise.      Past Medical History:  Diagnosis Date  . Breast density   . Pelvic pain   . Yeast vaginitis      Family History  Problem Relation Age of Onset  . COPD Mother   . Asthma Mother   . Cancer Father   . Allergies Father      Current Outpatient Medications:  .  amLODipine (NORVASC) 5 MG tablet, TAKE 1 TABLET BY MOUTH EVERY DAY, Disp: 90 tablet, Rfl: 1 .  fluticasone (FLONASE) 50 MCG/ACT nasal spray, Place 1 spray into both nostrils daily., Disp: 15.8 g, Rfl: 1 .  levothyroxine (SYNTHROID) 88 MCG tablet, TAKE 1 TABLET BY MOUTH MON - THUR AND 1/2 TAB ON FRI-SAT, Disp: 90 tablet, Rfl: 1 .  Vitamin D, Ergocalciferol, (DRISDOL) 1.25 MG (50000 UT) CAPS capsule, TAKE 1 CAPSULE BY ORAL ROUTE EVERY WEEK ON TUES/FRI, Disp: 8 capsule, Rfl: 2   Allergies  Allergen Reactions  . Peanuts [Peanut Oil]      The patient states she uses status post hysterectomy for birth control. Last LMP was No LMP recorded. Patient has had a hysterectomy.. Negative for Dysmenorrhea Negative for: breast discharge, breast lump(s), breast pain and breast self exam. Associated symptoms include abnormal vaginal bleeding. Pertinent negatives include abnormal bleeding (hematology),  anxiety, decreased libido, depression, difficulty falling sleep, dyspareunia, history of infertility, nocturia, sexual dysfunction, sleep disturbances, urinary incontinence, urinary urgency, vaginal discharge and vaginal itching. Diet regular.The patient states her exercise level is  intermittent.  . The patient's tobacco use is:  Social History   Tobacco Use  Smoking Status Never Smoker  Smokeless Tobacco Never Used  . She has been exposed to passive smoke. The patient's alcohol use is:  Social History   Substance and Sexual Activity  Alcohol Use No    Review of Systems  Constitutional: Negative.   HENT: Negative.   Eyes: Negative.  Negative for blurred vision.  Respiratory: Negative.  Negative for shortness of breath.   Cardiovascular: Negative.  Negative for chest pain and palpitations.  Endocrine: Negative.   Genitourinary: Negative.   Musculoskeletal: Positive for arthralgias.       She c/o left elbow pain. Sx started about a month ago. There is pain with movement. She denies fall/trauma. She has not tried any otc meds for relief.   Skin: Negative.   Allergic/Immunologic: Negative.   Neurological: Negative.   Hematological: Negative.   Psychiatric/Behavioral: Negative.      Today's Vitals   03/08/19 1130  BP: 140/80  Pulse: 75  Temp: 98.3 F (36.8 C)  TempSrc: Oral  SpO2: 95%  Weight: 162 lb 9.6 oz (73.8 kg)  Height: 5' 4.4" (1.636 m)   Body mass index is 27.56 kg/m.  Objective:  Physical Exam Vitals signs and nursing note reviewed.  Constitutional:      Appearance: Normal appearance.  HENT:     Head: Normocephalic and atraumatic.     Right Ear: Tympanic membrane, ear canal and external ear normal.     Left Ear: Tympanic membrane, ear canal and external ear normal.     Nose: Nose normal.     Mouth/Throat:     Mouth: Mucous membranes are moist.     Pharynx: Oropharynx is clear.  Eyes:     Extraocular Movements: Extraocular movements intact.      Conjunctiva/sclera: Conjunctivae normal.     Pupils: Pupils are equal, round, and reactive to light.  Neck:     Musculoskeletal: Normal range of motion and neck supple.  Cardiovascular:     Rate and Rhythm: Normal rate and regular rhythm.     Pulses: Normal pulses.     Heart sounds: Normal heart sounds.  Pulmonary:     Effort: Pulmonary effort is normal.     Breath sounds: Normal breath sounds.  Chest:     Breasts: Tanner Score is 5.        Right: Normal. No swelling, bleeding, inverted nipple, mass or nipple discharge.        Left: Normal. No swelling, bleeding, inverted nipple, mass or nipple discharge.  Abdominal:     General: Abdomen is flat. Bowel sounds are normal.     Palpations: Abdomen is soft.  Genitourinary:    Comments: deferred Musculoskeletal: Normal range of motion.     Left elbow: Tenderness found. Lateral epicondyle tenderness noted.  Skin:    General: Skin is warm and dry.  Neurological:     General: No focal deficit present.     Mental Status: She is alert and oriented to person, place, and time.  Psychiatric:        Mood and Affect: Mood normal.        Behavior: Behavior normal.         Assessment And Plan:     THE PATIENT IS ENCOURAGED TO PRACTICE SOCIAL DISTANCING DUE TO THE COVID-19 PANDEMIC.    1. Encounter for annual physical exam  A full exam was performed.  Importance of monthly self breast exams was discussed with the patient.  PATIENT HAS BEEN ADVISED TO GET 30-45 MINUTES REGULAR EXERCISE NO LESS THAN FOUR TO FIVE DAYS PER WEEK - BOTH WEIGHTBEARING EXERCISES AND AEROBIC ARE RECOMMENDED.  SHE WAS ADVISED TO FOLLOW A HEALTHY DIET WITH AT LEAST SIX FRUITS/VEGGIES PER DAY, DECREASE INTAKE OF RED MEAT, AND TO INCREASE FISH INTAKE TO TWO DAYS PER WEEK.  MEATS/FISH SHOULD NOT BE FRIED, BAKED OR BROILED IS PREFERABLE.  I SUGGEST WEARING SPF 50 SUNSCREEN ON EXPOSED PARTS AND ESPECIALLY WHEN IN THE DIRECT SUNLIGHT FOR AN EXTENDED PERIOD OF TIME.  PLEASE  AVOID FAST FOOD RESTAURANTS AND INCREASE YOUR WATER INTAKE.  - POCT Urinalysis Dipstick (81002) - POCT UA - Microalbumin - EKG 12-Lead  2. Essential hypertension, benign  Chronic, fair control.  She will continue with current meds. She is encouraged to avoid adding salt to her foods. EKG performed, no acute changes noted. She will rto in six months for re-evaluation.   - POCT Urinalysis Dipstick (81002) - POCT UA - Microalbumin - EKG 12-Lead   3. Left elbow tendinitis  She is advised to wear sleeve and use topical Voltaren gel to apply to affected area 2-3 times daily as needed. She agrees to touch base  with me in two weeks to let me know how she is doing.     Ivah Girardot N. Allyne Gee, MD

## 2019-03-08 NOTE — Progress Notes (Signed)
1. Encounter for annual physical exam  A full exam was performed.  Importance of monthly self breast exams was discussed with the patient.  PATIENT HAS BEEN ADVISED TO GET 30-45 MINUTES REGULAR EXERCISE NO LESS THAN FOUR TO FIVE DAYS PER WEEK - BOTH WEIGHTBEARING EXERCISES AND AEROBIC ARE RECOMMENDED.  SHE WAS ADVISED TO FOLLOW A HEALTHY DIET WITH AT LEAST SIX FRUITS/VEGGIES PER DAY, DECREASE INTAKE OF RED MEAT, AND TO INCREASE FISH INTAKE TO TWO DAYS PER WEEK.  MEATS/FISH SHOULD NOT BE FRIED, BAKED OR BROILED IS PREFERABLE.  I SUGGEST WEARING SPF 50 SUNSCREEN ON EXPOSED PARTS AND ESPECIALLY WHEN IN THE DIRECT SUNLIGHT FOR AN EXTENDED PERIOD OF TIME.  PLEASE AVOID FAST FOOD RESTAURANTS AND INCREASE YOUR WATER INTAKE.  - POCT Urinalysis Dipstick (81002) - POCT UA - Microalbumin - EKG 12-Lead  2. Essential hypertension, benign  Chronic, fair control.  She will continue with current meds. She is encouraged to avoid adding salt to her foods. EKG performed, no acute changes noted. She will rto in six months for re-evaluation.   - POCT Urinalysis Dipstick (81002) - POCT UA - Microalbumin - EKG 12-Lead   3. Left elbow tendinitis  She is advised to wear sleeve and use topical Voltaren gel to apply to affected area 2-3 times daily as needed. She agrees to touch base with me in two weeks to let me know how she is doing.

## 2019-03-09 LAB — CMP14+EGFR
ALT: 8 IU/L (ref 0–32)
AST: 13 IU/L (ref 0–40)
Albumin/Globulin Ratio: 1.5 (ref 1.2–2.2)
Albumin: 4.5 g/dL (ref 3.8–4.9)
Alkaline Phosphatase: 127 IU/L — ABNORMAL HIGH (ref 39–117)
BUN/Creatinine Ratio: 11 (ref 9–23)
BUN: 10 mg/dL (ref 6–24)
Bilirubin Total: 0.3 mg/dL (ref 0.0–1.2)
CO2: 27 mmol/L (ref 20–29)
Calcium: 10.1 mg/dL (ref 8.7–10.2)
Chloride: 102 mmol/L (ref 96–106)
Creatinine, Ser: 0.88 mg/dL (ref 0.57–1.00)
GFR calc Af Amer: 84 mL/min/{1.73_m2} (ref >59)
GFR calc non Af Amer: 73 mL/min/{1.73_m2} (ref >59)
Globulin, Total: 3.1 g/dL (ref 1.5–4.5)
Glucose: 71 mg/dL (ref 65–99)
Potassium: 4.1 mmol/L (ref 3.5–5.2)
Sodium: 143 mmol/L (ref 134–144)
Total Protein: 7.6 g/dL (ref 6.0–8.5)

## 2019-03-09 LAB — TSH: TSH: 1.13 u[IU]/mL (ref 0.450–4.500)

## 2019-03-09 LAB — LIPID PANEL
Chol/HDL Ratio: 4.8 ratio — ABNORMAL HIGH (ref 0.0–4.4)
Cholesterol, Total: 249 mg/dL — ABNORMAL HIGH (ref 100–199)
HDL: 52 mg/dL (ref >39)
LDL Chol Calc (NIH): 171 mg/dL — ABNORMAL HIGH (ref 0–99)
Triglycerides: 144 mg/dL (ref 0–149)
VLDL Cholesterol Cal: 26 mg/dL (ref 5–40)

## 2019-03-09 LAB — CBC
Hematocrit: 41.5 % (ref 34.0–46.6)
Hemoglobin: 13.9 g/dL (ref 11.1–15.9)
MCH: 29 pg (ref 26.6–33.0)
MCHC: 33.5 g/dL (ref 31.5–35.7)
MCV: 87 fL (ref 79–97)
Platelets: 314 10*3/uL (ref 150–450)
RBC: 4.79 x10E6/uL (ref 3.77–5.28)
RDW: 13.3 % (ref 11.7–15.4)
WBC: 7 10*3/uL (ref 3.4–10.8)

## 2019-03-09 LAB — HEMOGLOBIN A1C
Est. average glucose Bld gHb Est-mCnc: 120 mg/dL
Hgb A1c MFr Bld: 5.8 % — ABNORMAL HIGH (ref 4.8–5.6)

## 2019-03-09 LAB — T4, FREE: Free T4: 1.31 ng/dL (ref 0.82–1.77)

## 2019-05-24 ENCOUNTER — Other Ambulatory Visit: Payer: Self-pay | Admitting: Internal Medicine

## 2019-06-20 ENCOUNTER — Telehealth (INDEPENDENT_AMBULATORY_CARE_PROVIDER_SITE_OTHER): Payer: BC Managed Care – PPO | Admitting: Internal Medicine

## 2019-06-20 ENCOUNTER — Other Ambulatory Visit: Payer: Self-pay

## 2019-06-20 ENCOUNTER — Encounter: Payer: Self-pay | Admitting: Internal Medicine

## 2019-06-20 VITALS — Temp 98.1°F | Ht 63.0 in | Wt 159.5 lb

## 2019-06-20 DIAGNOSIS — J0100 Acute maxillary sinusitis, unspecified: Secondary | ICD-10-CM

## 2019-06-20 DIAGNOSIS — H9201 Otalgia, right ear: Secondary | ICD-10-CM

## 2019-06-20 MED ORDER — AMOXICILLIN-POT CLAVULANATE 875-125 MG PO TABS: 1.0000 | ORAL_TABLET | Freq: Two times a day (BID) | ORAL | 0 refills | Status: AC

## 2019-06-20 NOTE — Patient Instructions (Signed)
Sinusitis, Adult Sinusitis is inflammation of your sinuses. Sinuses are hollow spaces in the bones around your face. Your sinuses are located:  Around your eyes.  In the middle of your forehead.  Behind your nose.  In your cheekbones. Mucus normally drains out of your sinuses. When your nasal tissues become inflamed or swollen, mucus can become trapped or blocked. This allows bacteria, viruses, and fungi to grow, which leads to infection. Most infections of the sinuses are caused by a virus. Sinusitis can develop quickly. It can last for up to 4 weeks (acute) or for more than 12 weeks (chronic). Sinusitis often develops after a cold. What are the causes? This condition is caused by anything that creates swelling in the sinuses or stops mucus from draining. This includes:  Allergies.  Asthma.  Infection from bacteria or viruses.  Deformities or blockages in your nose or sinuses.  Abnormal growths in the nose (nasal polyps).  Pollutants, such as chemicals or irritants in the air.  Infection from fungi (rare). What increases the risk? You are more likely to develop this condition if you:  Have a weak body defense system (immune system).  Do a lot of swimming or diving.  Overuse nasal sprays.  Smoke. What are the signs or symptoms? The main symptoms of this condition are pain and a feeling of pressure around the affected sinuses. Other symptoms include:  Stuffy nose or congestion.  Thick drainage from your nose.  Swelling and warmth over the affected sinuses.  Headache.  Upper toothache.  A cough that may get worse at night.  Extra mucus that collects in the throat or the back of the nose (postnasal drip).  Decreased sense of smell and taste.  Fatigue.  A fever.  Sore throat.  Bad breath. How is this diagnosed? This condition is diagnosed based on:  Your symptoms.  Your medical history.  A physical exam.  Tests to find out if your condition is  acute or chronic. This may include: ? Checking your nose for nasal polyps. ? Viewing your sinuses using a device that has a light (endoscope). ? Testing for allergies or bacteria. ? Imaging tests, such as an MRI or CT scan. In rare cases, a bone biopsy may be done to rule out more serious types of fungal sinus disease. How is this treated? Treatment for sinusitis depends on the cause and whether your condition is chronic or acute.  If caused by a virus, your symptoms should go away on their own within 10 days. You may be given medicines to relieve symptoms. They include: ? Medicines that shrink swollen nasal passages (topical intranasal decongestants). ? Medicines that treat allergies (antihistamines). ? A spray that eases inflammation of the nostrils (topical intranasal corticosteroids). ? Rinses that help get rid of thick mucus in your nose (nasal saline washes).  If caused by bacteria, your health care provider may recommend waiting to see if your symptoms improve. Most bacterial infections will get better without antibiotic medicine. You may be given antibiotics if you have: ? A severe infection. ? A weak immune system.  If caused by narrow nasal passages or nasal polyps, you may need to have surgery. Follow these instructions at home: Medicines  Take, use, or apply over-the-counter and prescription medicines only as told by your health care provider. These may include nasal sprays.  If you were prescribed an antibiotic medicine, take it as told by your health care provider. Do not stop taking the antibiotic even if you start   to feel better. Hydrate and humidify   Drink enough fluid to keep your urine pale yellow. Staying hydrated will help to thin your mucus.  Use a cool mist humidifier to keep the humidity level in your home above 50%.  Inhale steam for 10-15 minutes, 3-4 times a day, or as told by your health care provider. You can do this in the bathroom while a hot shower is  running.  Limit your exposure to cool or dry air. Rest  Rest as much as possible.  Sleep with your head raised (elevated).  Make sure you get enough sleep each night. General instructions   Apply a warm, moist washcloth to your face 3-4 times a day or as told by your health care provider. This will help with discomfort.  Wash your hands often with soap and water to reduce your exposure to germs. If soap and water are not available, use hand sanitizer.  Do not smoke. Avoid being around people who are smoking (secondhand smoke).  Keep all follow-up visits as told by your health care provider. This is important. Contact a health care provider if:  You have a fever.  Your symptoms get worse.  Your symptoms do not improve within 10 days. Get help right away if:  You have a severe headache.  You have persistent vomiting.  You have severe pain or swelling around your face or eyes.  You have vision problems.  You develop confusion.  Your neck is stiff.  You have trouble breathing. Summary  Sinusitis is soreness and inflammation of your sinuses. Sinuses are hollow spaces in the bones around your face.  This condition is caused by nasal tissues that become inflamed or swollen. The swelling traps or blocks the flow of mucus. This allows bacteria, viruses, and fungi to grow, which leads to infection.  If you were prescribed an antibiotic medicine, take it as told by your health care provider. Do not stop taking the antibiotic even if you start to feel better.  Keep all follow-up visits as told by your health care provider. This is important. This information is not intended to replace advice given to you by your health care provider. Make sure you discuss any questions you have with your health care provider. Document Revised: 10/26/2017 Document Reviewed: 10/26/2017 Elsevier Patient Education  2020 [employer]  

## 2019-06-20 NOTE — Progress Notes (Signed)
Virtual Visit via Video   This visit type was conducted due to national recommendations for restrictions regarding the COVID-19 Pandemic (e.g. social distancing) in an effort to limit this patient's exposure and mitigate transmission in our community.  Due to her co-morbid illnesses, this patient is at least at moderate risk for complications without adequate follow up.  This format is felt to be most appropriate for this patient at this time.  All issues noted in this document were discussed and addressed.  A limited physical exam was performed with this format.    This visit type was conducted due to national recommendations for restrictions regarding the COVID-19 Pandemic (e.g. social distancing) in an effort to limit this patient's exposure and mitigate transmission in our community.  Patients identity confirmed using two different identifiers.  This format is felt to be most appropriate for this patient at this time.  All issues noted in this document were discussed and addressed.  No physical exam was performed (except for noted visual exam findings with Video Visits).    Date:  06/20/2019   ID:  Kaitlyn Terry, DOB 1960/08/27, MRN [id number]  Patient Location:  Work  Provider location:   [employer] Complaint:  "I think I have a sinus infection.   History of Present Illness:    Kaitlyn Terry is a 59 y.o. female who presents via video conferencing for a telehealth visit today.    The patient does not have symptoms concerning for COVID-19 infection (fever, chills, cough, or new shortness of breath).   She presents today for virtual visit. She prefers this method of contact due to COVID-19 pandemic.  She is concerned that she may have a sinus infection.  She c/o sinus congestion, facial pain and tooth pain. She denies having a fever. She denies ill contacts. She denies any known exposure to COVID-19. Associated symptoms include ear pain and a sore throat.   Sinus Problem The  current episode started in the past 7 days. The problem is unchanged. There has been no fever. Her pain is at a severity of 5/10. The pain is moderate. Associated symptoms include congestion, ear pain, sinus pressure and a sore throat. Pertinent negatives include no coughing. Past treatments include oral decongestants. The treatment provided no relief.     Past Medical History:  Diagnosis Date  . Breast density   . Pelvic pain   . Yeast vaginitis    Past Surgical History:  Procedure Laterality Date  . ABDOMINAL HYSTERECTOMY       Current Meds  Medication Sig  . amLODipine (NORVASC) 5 MG tablet TAKE 1 TABLET BY MOUTH EVERY DAY  . levothyroxine (SYNTHROID) 88 MCG tablet TAKE 1 TABLET BY MOUTH MON - THUR AND 1/2 TAB ON FRI-SAT  . Vitamin D, Ergocalciferol, (DRISDOL) 1.25 MG (50000 UT) CAPS capsule TAKE 1 CAPSULE BY ORAL ROUTE EVERY WEEK ON TUES/FRI     Allergies:   Peanuts [peanut oil]   Social History   Tobacco Use  . Smoking status: Never Smoker  . Smokeless tobacco: Never Used  Substance Use Topics  . Alcohol use: No  . Drug use: No     Family Hx: The patient's family history includes Allergies in her father; Asthma in her mother; COPD in her mother; Cancer in her father.  ROS:   Please see the history of present illness.    Review of Systems  Constitutional: Negative.   HENT: Positive for congestion, ear pain, sinus pressure  and sore throat.   Respiratory: Negative.  Negative for cough.   Cardiovascular: Negative.   Gastrointestinal: Negative.   Neurological: Negative.   Psychiatric/Behavioral: Negative.     All other systems reviewed and are negative.   Labs/Other Tests and Data Reviewed:    Recent Labs: 03/08/2019: ALT 8; BUN 10; Creatinine, Ser 0.88; Hemoglobin 13.9; Platelets 314; Potassium 4.1; Sodium 143; TSH 1.130   Recent Lipid Panel Lab Results  Component Value Date/Time   CHOL 249 (H) 03/08/2019 02:22 PM   TRIG 144 03/08/2019 02:22 PM   HDL 52  03/08/2019 02:22 PM   CHOLHDL 4.8 (H) 03/08/2019 02:22 PM   LDLCALC 171 (H) 03/08/2019 02:22 PM    Wt Readings from Last 3 Encounters:  06/20/19 159 lb 8 oz (72.3 kg)  03/08/19 162 lb 9.6 oz (73.8 kg)  09/28/18 158 lb 12.8 oz (72 kg)     Exam:    Vital Signs:  Temp 98.1 F (36.7 C)   Ht 5\' 3"  (1.6 m)   Wt 159 lb 8 oz (72.3 kg)   BMI 28.25 kg/m     Physical Exam  Constitutional: She is oriented to person, place, and time and well-developed, well-nourished, and in no distress.  HENT:  Head: Normocephalic and atraumatic.  Sounds congested  Pulmonary/Chest: Effort normal.  Musculoskeletal:     Cervical back: Normal range of motion.  Neurological: She is alert and oriented to person, place, and time.  Psychiatric: Affect normal.  Nursing note and vitals reviewed.   ASSESSMENT & PLAN:     1. Acute maxillary sinusitis, recurrence not specified  She denies having any allergies to medication. She was given rx Augmentin 875mg  to take twice daily and encouraged to complete full abx course. She is encouraged to avoid dairy until she has completed her abx. She is advised to update me in 48 hours to let me know how she is doing.   2. Otalgia of right ear  She declined use of Norel AD, she feels Mucinex is more effective. Her sx should improve with above abx use. She will let me know if her sx persist.     COVID-19 Education: The signs and symptoms of COVID-19 were discussed with the patient and how to seek care for testing (follow up with PCP or arrange E-visit).  The importance of social distancing was discussed today.  Patient Risk:   After full review of this patients clinical status, I feel that they are at least moderate risk at this time.     Medication Adjustments/Labs and Tests Ordered: Current medicines are reviewed at length with the patient today.  Concerns regarding medicines are outlined above.   Tests Ordered: No orders of the defined types were placed in  this encounter.   Medication Changes: No orders of the defined types were placed in this encounter.   Disposition:  Follow up prn  Signed, , MD

## 2019-07-07 ENCOUNTER — Other Ambulatory Visit: Payer: Self-pay

## 2019-07-07 ENCOUNTER — Encounter: Payer: Self-pay | Admitting: Internal Medicine

## 2019-07-07 ENCOUNTER — Telehealth (INDEPENDENT_AMBULATORY_CARE_PROVIDER_SITE_OTHER): Payer: BC Managed Care – PPO | Admitting: Internal Medicine

## 2019-07-07 VITALS — Temp 98.7°F | Ht 63.0 in

## 2019-07-07 DIAGNOSIS — U071 COVID-19: Secondary | ICD-10-CM | POA: Diagnosis not present

## 2019-07-07 MED ORDER — AZITHROMYCIN 250 MG PO TABS: ORAL_TABLET | ORAL | 0 refills | Status: AC

## 2019-07-07 MED ORDER — ALBUTEROL SULFATE HFA 108 (90 BASE) MCG/ACT IN AERS: 2.0000 | INHALATION_SPRAY | Freq: Four times a day (QID) | RESPIRATORY_TRACT | 1 refills | Status: DC | PRN

## 2019-07-08 NOTE — Progress Notes (Signed)
Virtual Visit via Video   This visit type was conducted due to national recommendations for restrictions regarding the COVID-19 Pandemic (e.g. social distancing) in an effort to limit this patient's exposure and mitigate transmission in our community.  Due to her co-morbid illnesses, this patient is at least at moderate risk for complications without adequate follow up.  This format is felt to be most appropriate for this patient at this time.  All issues noted in this document were discussed and addressed.  A limited physical exam was performed with this format.    This visit type was conducted due to national recommendations for restrictions regarding the COVID-19 Pandemic (e.g. social distancing) in an effort to limit this patient's exposure and mitigate transmission in our community.  Patients identity confirmed using two different identifiers.  This format is felt to be most appropriate for this patient at this time.  All issues noted in this document were discussed and addressed.  No physical exam was performed (except for noted visual exam findings with Video Visits).    Date:  07/08/2019   ID:  Cleophas Dunker, DOB 1960/07/07, MRN [id number]  Patient Location:  Home, accompanied by her husband  Provider location:   Office    Chief Complaint:  "I have COVID"  History of Present Illness:    LAVAUGHN BISIG is a 59 y.o. female who presents via video conferencing for a telehealth visit today.    The patient does have symptoms concerning for COVID-19 infection (fever, chills, cough, or new shortness of breath).   She presents today for virtual visit. She prefers this method of contact due to COVID-19 pandemic. She presents today for further evaluation after receiving positive COVID diagnosis. She reports she developed a cough on Monday, 3 days after her husband developed symptoms. She also had fever last night, 100.4, and this has resolved with Tylenol. He thinks he caught infection from  someone at work. She went for testing at local urgent care on Tuesday and received positive results earlier today. She was not given any medication or recommendations. Her cough is non-productive. She denies SOB at rest and with exertion.     Past Medical History:  Diagnosis Date  . Breast density   . Pelvic pain   . Yeast vaginitis    Past Surgical History:  Procedure Laterality Date  . ABDOMINAL HYSTERECTOMY       Current Meds  Medication Sig  . amLODipine (NORVASC) 5 MG tablet TAKE 1 TABLET BY MOUTH EVERY DAY  . fluticasone (FLONASE) 50 MCG/ACT nasal spray Place 1 spray into both nostrils daily.  Marland Kitchen levothyroxine (SYNTHROID) 88 MCG tablet TAKE 1 TABLET BY MOUTH MON - THUR AND 1/2 TAB ON FRI-SAT (Patient taking differently: TAKE 1 TABLET BY MOUTH MON - FRI and 1/2 tablet on SAT)  . Vitamin D, Ergocalciferol, (DRISDOL) 1.25 MG (50000 UT) CAPS capsule TAKE 1 CAPSULE BY ORAL ROUTE EVERY WEEK ON TUES/FRI     Allergies:   Peanuts [peanut oil]   Social History   Tobacco Use  . Smoking status: Never Smoker  . Smokeless tobacco: Never Used  Substance Use Topics  . Alcohol use: No  . Drug use: No     Family Hx: The patient's family history includes Allergies in her father; Asthma in her mother; COPD in her mother; Cancer in her father.  ROS:   Please see the history of present illness.    Review of Systems  Constitutional: Positive for fever and malaise/fatigue.  Respiratory: Positive for cough.   Cardiovascular: Negative.   Gastrointestinal: Negative.   Neurological: Negative.   Psychiatric/Behavioral: Negative.     All other systems reviewed and are negative.   Labs/Other Tests and Data Reviewed:    Recent Labs: 03/08/2019: ALT 8; BUN 10; Creatinine, Ser 0.88; Hemoglobin 13.9; Platelets 314; Potassium 4.1; Sodium 143; TSH 1.130   Recent Lipid Panel Lab Results  Component Value Date/Time   CHOL 249 (H) 03/08/2019 02:22 PM   TRIG 144 03/08/2019 02:22 PM   HDL 52  03/08/2019 02:22 PM   CHOLHDL 4.8 (H) 03/08/2019 02:22 PM   LDLCALC 171 (H) 03/08/2019 02:22 PM    Wt Readings from Last 3 Encounters:  06/20/19 159 lb 8 oz (72.3 kg)  03/08/19 162 lb 9.6 oz (73.8 kg)  09/28/18 158 lb 12.8 oz (72 kg)     Exam:    Vital Signs:  Temp 98.7 F (37.1 C) Comment: pt provided  Ht 5\' 3"  (1.6 m)   BMI 28.25 kg/m     Physical Exam  Constitutional: She is oriented to person, place, and time and well-developed, well-nourished, and in no distress.  She appears ill  HENT:  Head: Normocephalic and atraumatic.  Pulmonary/Chest: Effort normal.  Musculoskeletal:     Cervical back: Normal range of motion.  Neurological: She is alert and oriented to person, place, and time.  Psychiatric: Affect normal.  Nursing note and vitals reviewed.   ASSESSMENT & PLAN:     1. COVID-19  She appears to be developing bronchitis. I will send in rx Zpak, she is encouraged to take full antibiotic course. She was also given rx albuterol inhaler to use prn.  She is encouraged to check temperature daily. She is also advised to go to ER immediately if she develops SOB.   - Temperature monitoring; Future    COVID-19 Education: The signs and symptoms of COVID-19 were discussed with the patient and how to seek care for testing (follow up with PCP or arrange E-visit).  The importance of social distancing was discussed today.  Patient Risk:   After full review of this patients clinical status, I feel that they are at least moderate risk at this time.       Medication Adjustments/Labs and Tests Ordered: Current medicines are reviewed at length with the patient today.  Concerns regarding medicines are outlined above.   Tests Ordered: No orders of the defined types were placed in this encounter.   Medication Changes: Meds ordered this encounter  Medications  . azithromycin (ZITHROMAX) 250 MG tablet    Sig: Take 2 tablets (500 mg) on  Day 1,  followed by 1 tablet (250 mg)  once daily on Days 2 through 5.    Dispense:  6 each    Refill:  0  . albuterol (VENTOLIN HFA) 108 (90 Base) MCG/ACT inhaler    Sig: Inhale 2 puffs into the lungs every 6 (six) hours as needed for wheezing or shortness of breath.    Dispense:  18 g    Refill:  1    Disposition:  Follow up prn  Signed, , MD

## 2019-07-11 ENCOUNTER — Telehealth: Payer: Self-pay

## 2019-07-11 NOTE — Telephone Encounter (Signed)
Called to check on pt and husband (Kaitlyn Terry) since their diagnosis of covid. Pt stated that they are doing fine. Encouraged to call if they have any concerns

## 2019-09-05 ENCOUNTER — Ambulatory Visit: Payer: BC Managed Care – PPO | Admitting: Internal Medicine

## 2019-09-06 ENCOUNTER — Ambulatory Visit: Payer: BC Managed Care – PPO | Admitting: Internal Medicine

## 2019-09-12 ENCOUNTER — Other Ambulatory Visit: Payer: Self-pay | Admitting: Internal Medicine

## 2019-09-22 ENCOUNTER — Other Ambulatory Visit: Payer: Self-pay

## 2019-09-22 ENCOUNTER — Ambulatory Visit: Payer: BC Managed Care – PPO | Admitting: Internal Medicine

## 2019-09-22 ENCOUNTER — Encounter: Payer: Self-pay | Admitting: Internal Medicine

## 2019-09-22 VITALS — BP 122/68 | HR 74 | Temp 98.3°F | Ht 63.8 in | Wt 158.6 lb

## 2019-09-22 DIAGNOSIS — E663 Overweight: Secondary | ICD-10-CM

## 2019-09-22 DIAGNOSIS — R202 Paresthesia of skin: Secondary | ICD-10-CM | POA: Diagnosis not present

## 2019-09-22 DIAGNOSIS — Z6827 Body mass index (BMI) 27.0-27.9, adult: Secondary | ICD-10-CM

## 2019-09-22 DIAGNOSIS — E039 Hypothyroidism, unspecified: Secondary | ICD-10-CM | POA: Diagnosis not present

## 2019-09-22 DIAGNOSIS — R7309 Other abnormal glucose: Secondary | ICD-10-CM

## 2019-09-22 DIAGNOSIS — E559 Vitamin D deficiency, unspecified: Secondary | ICD-10-CM

## 2019-09-22 DIAGNOSIS — I10 Essential (primary) hypertension: Secondary | ICD-10-CM | POA: Diagnosis not present

## 2019-09-22 MED ORDER — VITAMIN D (ERGOCALCIFEROL) 1.25 MG (50000 UNIT) PO CAPS: ORAL_CAPSULE | ORAL | 2 refills | Status: DC

## 2019-09-22 NOTE — Progress Notes (Signed)
This visit occurred during the SARS-CoV-2 public health emergency.  Safety protocols were in place, including screening questions prior to the visit, additional usage of staff PPE, and extensive cleaning of exam room while observing appropriate contact time as indicated for disinfecting solutions.  Subjective:     Patient ID: Kaitlyn Terry , female    DOB: 11-Feb-1961 , 59 y.o.   MRN: [id number]   Chief Complaint  Patient presents with  . Hypertension    HPI  Hypertension This is a chronic problem. The current episode started more than 1 month ago. The problem has been gradually improving since onset. The problem is controlled. Pertinent negatives include no blurred vision, chest pain, palpitations or shortness of breath. Risk factors for coronary artery disease include post-menopausal state.     Past Medical History:  Diagnosis Date  . Breast density   . Pelvic pain   . Yeast vaginitis      Family History  Problem Relation Age of Onset  . COPD Mother   . Asthma Mother   . Cancer Father   . Allergies Father      Current Outpatient Medications:  .  amLODipine (NORVASC) 5 MG tablet, TAKE 1 TABLET BY MOUTH EVERY DAY, Disp: 90 tablet, Rfl: 1 .  fluticasone (FLONASE) 50 MCG/ACT nasal spray, Place 1 spray into both nostrils daily., Disp: 15.8 g, Rfl: 1 .  levothyroxine (SYNTHROID) 88 MCG tablet, TAKE 1 TABLET BY MOUTH MON - THUR AND 1/2 TAB ON FRI-SAT (Patient taking differently: TAKE 1 TABLET BY MOUTH MON - FRI and 1/2 tablet on SAT), Disp: 90 tablet, Rfl: 1 .  albuterol (VENTOLIN HFA) 108 (90 Base) MCG/ACT inhaler, Inhale 2 puffs into the lungs every 6 (six) hours as needed for wheezing or shortness of breath. (Patient not taking: Reported on 09/22/2019), Disp: 18 g, Rfl: 1 .  Vitamin D, Ergocalciferol, (DRISDOL) 1.25 MG (50000 UNIT) CAPS capsule, TAKE 1 CAPSULE BY ORAL ROUTE EVERY WEEK ON TUES/FRI, Disp: 8 capsule, Rfl: 2   Allergies  Allergen Reactions  . Peanuts [Peanut Oil]       Review of Systems  Constitutional: Negative.   Eyes: Negative for blurred vision.  Respiratory: Negative.  Negative for shortness of breath.   Cardiovascular: Negative.  Negative for chest pain and palpitations.  Gastrointestinal: Negative.   Neurological: Positive for numbness.       She c/o tingling in her toes. Sx usually occur after eating candy. She reports that she likes candy - [place] [employer], etc. She denies having any LE weakness.  Can occur while wearing shoes and while barefoot.   Psychiatric/Behavioral: Negative.      Today's Vitals   09/22/19 1508  BP: 122/68  Pulse: 74  Temp: 98.3 F (36.8 C)  Weight: 158 lb 9.6 oz (71.9 kg)  Height: 5' 3.8" (1.621 m)   Body mass index is 27.39 kg/m.   Wt Readings from Last 3 Encounters:  09/22/19 158 lb 9.6 oz (71.9 kg)  06/20/19 159 lb 8 oz (72.3 kg)  03/08/19 162 lb 9.6 oz (73.8 kg)     Objective:  Physical Exam Vitals and nursing note reviewed.  Constitutional:      Appearance: Normal appearance.  HENT:     Head: Normocephalic and atraumatic.  Cardiovascular:     Rate and Rhythm: Normal rate and regular rhythm.     Pulses:          Dorsalis pedis pulses are 2+ on the right side and 2+ on  the left side.     Heart sounds: Normal heart sounds.  Pulmonary:     Effort: Pulmonary effort is normal.     Breath sounds: Normal breath sounds.  Skin:    General: Skin is warm.  Neurological:     General: No focal deficit present.     Mental Status: She is alert.  Psychiatric:        Mood and Affect: Mood normal.        Behavior: Behavior normal.         Assessment And Plan:     1. Essential hypertension, benign  Chronic, well controlled. She will continue with current meds. She is encouraged to avoid adding salt to her foods. I will check renal function today.   - CMP14+EGFR  2. Primary hypothyroidism  I will check thyroid panel and adjust meds as needed.  - TSH  3. Paresthesia of lower  extremity  Patient has good DP pulses. Pt advised her sx could be due to vit b12 deficiency or poorly controlled hypothyroidism. I will check appropriate bloodwork and make further recommendations once her labs are available for review. If persistent, will consider EMG/NCS. Since her sx are precipitated by candy intake,it would be prudent to cut these things out of her diet.    4. Vitamin D deficiency disease  I WILL CHECK A VIT D LEVEL AND SUPPLEMENT AS NEEDED.  ALSO ENCOURAGED TO SPEND 15 MINUTES IN THE SUN DAILY.  - Vitamin D (25 hydroxy)  5. Other abnormal glucose  HER A1C HAS BEEN ELEVATED IN THE PAST. I WILL CHECK AN A1C, BMET TODAY. SHE WAS ENCOURAGED TO AVOID SUGARY BEVERAGES AND PROCESSED FOODS INCLUDNG BREADS, RICE AND PASTA.  - Hemoglobin A1c  6. Overweight with body mass index (BMI) of 27 to 27.9 in adult  She has not had any weight gain since her last visit. Advised to exercise at least 30 minutes five days per week.   Maximino Greenland, MD    THE PATIENT IS ENCOURAGED TO PRACTICE SOCIAL DISTANCING DUE TO THE COVID-19 PANDEMIC.

## 2019-09-23 LAB — CMP14+EGFR
ALT: 9 IU/L (ref 0–32)
AST: 14 IU/L (ref 0–40)
Albumin/Globulin Ratio: 1.5 (ref 1.2–2.2)
Albumin: 4.2 g/dL (ref 3.8–4.9)
Alkaline Phosphatase: 117 IU/L (ref 39–117)
BUN/Creatinine Ratio: 10 (ref 9–23)
BUN: 9 mg/dL (ref 6–24)
Bilirubin Total: 0.2 mg/dL (ref 0.0–1.2)
CO2: 31 mmol/L — ABNORMAL HIGH (ref 20–29)
Calcium: 9.8 mg/dL (ref 8.7–10.2)
Chloride: 101 mmol/L (ref 96–106)
Creatinine, Ser: 0.94 mg/dL (ref 0.57–1.00)
GFR calc Af Amer: 78 mL/min/{1.73_m2} (ref >59)
GFR calc non Af Amer: 68 mL/min/{1.73_m2} (ref >59)
Globulin, Total: 2.8 g/dL (ref 1.5–4.5)
Glucose: 87 mg/dL (ref 65–99)
Potassium: 4.5 mmol/L (ref 3.5–5.2)
Sodium: 143 mmol/L (ref 134–144)
Total Protein: 7 g/dL (ref 6.0–8.5)

## 2019-09-23 LAB — HEMOGLOBIN A1C
Est. average glucose Bld gHb Est-mCnc: 117 mg/dL
Hgb A1c MFr Bld: 5.7 % — ABNORMAL HIGH (ref 4.8–5.6)

## 2019-09-23 LAB — VITAMIN D 25 HYDROXY (VIT D DEFICIENCY, FRACTURES): Vit D, 25-Hydroxy: 37.2 ng/mL (ref 30.0–100.0)

## 2019-09-23 LAB — TSH: TSH: 0.268 u[IU]/mL — ABNORMAL LOW (ref 0.450–4.500)

## 2019-09-23 LAB — VITAMIN B12: Vitamin B-12: 544 pg/mL (ref 232–1245)

## 2019-10-10 ENCOUNTER — Encounter: Payer: Self-pay | Admitting: Internal Medicine

## 2019-10-10 ENCOUNTER — Other Ambulatory Visit: Payer: Self-pay

## 2019-10-10 MED ORDER — LEVOTHYROXINE SODIUM 88 MCG PO TABS: ORAL_TABLET | ORAL | 1 refills | Status: DC

## 2020-01-31 ENCOUNTER — Other Ambulatory Visit: Payer: Self-pay | Admitting: Internal Medicine

## 2020-03-21 ENCOUNTER — Encounter: Payer: Self-pay | Admitting: Internal Medicine

## 2020-03-21 ENCOUNTER — Ambulatory Visit: Payer: BC Managed Care – PPO | Admitting: Internal Medicine

## 2020-03-21 ENCOUNTER — Other Ambulatory Visit: Payer: Self-pay

## 2020-03-21 VITALS — BP 112/70 | HR 66 | Temp 97.9°F | Ht 63.6 in | Wt 157.2 lb

## 2020-03-21 DIAGNOSIS — R1031 Right lower quadrant pain: Secondary | ICD-10-CM | POA: Diagnosis not present

## 2020-03-21 DIAGNOSIS — H6123 Impacted cerumen, bilateral: Secondary | ICD-10-CM | POA: Diagnosis not present

## 2020-03-21 DIAGNOSIS — I10 Essential (primary) hypertension: Secondary | ICD-10-CM

## 2020-03-21 DIAGNOSIS — H6121 Impacted cerumen, right ear: Secondary | ICD-10-CM | POA: Insufficient documentation

## 2020-03-21 DIAGNOSIS — Z0000 Encounter for general adult medical examination without abnormal findings: Secondary | ICD-10-CM

## 2020-03-21 DIAGNOSIS — E039 Hypothyroidism, unspecified: Secondary | ICD-10-CM | POA: Insufficient documentation

## 2020-03-21 LAB — POCT URINALYSIS DIPSTICK
Bilirubin, UA: NEGATIVE
Blood, UA: NEGATIVE
Glucose, UA: NEGATIVE
Ketones, UA: NEGATIVE
Leukocytes, UA: NEGATIVE
Nitrite, UA: NEGATIVE
Protein, UA: NEGATIVE
Spec Grav, UA: 1.01 (ref 1.010–1.025)
Urobilinogen, UA: 0.2 E.U./dL
pH, UA: 6.5 (ref 5.0–8.0)

## 2020-03-21 LAB — POCT UA - MICROALBUMIN
Albumin/Creatinine Ratio, Urine, POC: <30
Creatinine, POC: 50 mg/dL
Microalbumin Ur, POC: 10 mg/L

## 2020-03-21 NOTE — Patient Instructions (Signed)
Health Maintenance, Female Adopting a healthy lifestyle and getting preventive care are important in promoting health and wellness. Ask your health care provider about:  The right schedule for you to have regular tests and exams.  Things you can do on your own to prevent diseases and keep yourself healthy. What should I know about diet, weight, and exercise? Eat a healthy diet   Eat a diet that includes plenty of vegetables, fruits, low-fat dairy products, and lean protein.  Do not eat a lot of foods that are high in solid fats, added sugars, or sodium. Maintain a healthy weight Body mass index (BMI) is used to identify weight problems. It estimates body fat based on height and weight. Your health care provider can help determine your BMI and help you achieve or maintain a healthy weight. Get regular exercise Get regular exercise. This is one of the most important things you can do for your health. Most adults should:  Exercise for at least 150 minutes each week. The exercise should increase your heart rate and make you sweat (moderate-intensity exercise).  Do strengthening exercises at least twice a week. This is in addition to the moderate-intensity exercise.  Spend less time sitting. Even light physical activity can be beneficial. Watch cholesterol and blood lipids Have your blood tested for lipids and cholesterol at 58 years of age, then have this test every 5 years. Have your cholesterol levels checked more often if:  Your lipid or cholesterol levels are high.  You are older than 58 years of age.  You are at high risk for heart disease. What should I know about cancer screening? Depending on your health history and family history, you may need to have cancer screening at various ages. This may include screening for:  Breast cancer.  Cervical cancer.  Colorectal cancer.  Skin cancer.  Lung cancer. What should I know about heart disease, diabetes, and high blood  pressure? Blood pressure and heart disease  High blood pressure causes heart disease and increases the risk of stroke. This is more likely to develop in people who have high blood pressure readings, are of African descent, or are overweight.  Have your blood pressure checked: ? Every 3-5 years if you are 18-39 years of age. ? Every year if you are 40 years old or older. Diabetes Have regular diabetes screenings. This checks your fasting blood sugar level. Have the screening done:  Once every three years after age 40 if you are at a normal weight and have a low risk for diabetes.  More often and at a younger age if you are overweight or have a high risk for diabetes. What should I know about preventing infection? Hepatitis B If you have a higher risk for hepatitis B, you should be screened for this virus. Talk with your health care provider to find out if you are at risk for hepatitis B infection. Hepatitis C Testing is recommended for:  Everyone born from 1945 through 1965.  Anyone with known risk factors for hepatitis C. Sexually transmitted infections (STIs)  Get screened for STIs, including gonorrhea and chlamydia, if: ? You are sexually active and are younger than 58 years of age. ? You are older than 58 years of age and your health care provider tells you that you are at risk for this type of infection. ? Your sexual activity has changed since you were last screened, and you are at increased risk for chlamydia or gonorrhea. Ask your health care provider if   you are at risk.  Ask your health care provider about whether you are at high risk for HIV. Your health care provider may recommend a prescription medicine to help prevent HIV infection. If you choose to take medicine to prevent HIV, you should first get tested for HIV. You should then be tested every 3 months for as long as you are taking the medicine. Pregnancy  If you are about to stop having your period (premenopausal) and  you may become pregnant, seek counseling before you get pregnant.  Take 400 to 800 micrograms (mcg) of folic acid every day if you become pregnant.  Ask for birth control (contraception) if you want to prevent pregnancy. Osteoporosis and menopause Osteoporosis is a disease in which the bones lose minerals and strength with aging. This can result in bone fractures. If you are 65 years old or older, or if you are at risk for osteoporosis and fractures, ask your health care provider if you should:  Be screened for bone loss.  Take a calcium or vitamin D supplement to lower your risk of fractures.  Be given hormone replacement therapy (HRT) to treat symptoms of menopause. Follow these instructions at home: Lifestyle  Do not use any products that contain nicotine or tobacco, such as cigarettes, e-cigarettes, and chewing tobacco. If you need help quitting, ask your health care provider.  Do not use street drugs.  Do not share needles.  Ask your health care provider for help if you need support or information about quitting drugs. Alcohol use  Do not drink alcohol if: ? Your health care provider tells you not to drink. ? You are pregnant, may be pregnant, or are planning to become pregnant.  If you drink alcohol: ? Limit how much you use to 0-1 drink a day. ? Limit intake if you are breastfeeding.  Be aware of how much alcohol is in your drink. In the [place] one drink equals one 12 oz bottle of beer (355 mL), one 5 oz glass of wine (148 mL), or one 1 oz glass of hard liquor (44 mL). General instructions  Schedule regular health, dental, and eye exams.  Stay current with your vaccines.  Tell your health care provider if: ? You often feel depressed. ? You have ever been abused or do not feel safe at home. Summary  Adopting a healthy lifestyle and getting preventive care are important in promoting health and wellness.  Follow your health care provider's instructions about healthy  diet, exercising, and getting tested or screened for diseases.  Follow your health care provider's instructions on monitoring your cholesterol and blood pressure. This information is not intended to replace advice given to you by your health care provider. Make sure you discuss any questions you have with your health care provider. Document Revised: 05/19/2018 Document Reviewed: 05/19/2018 Elsevier Patient Education  2020 [employer]  

## 2020-03-21 NOTE — Progress Notes (Signed)
I,Katawbba Wiggins,acting as a Education administrator for Maximino Greenland, MD.,have documented all relevant documentation on the behalf of Maximino Greenland, MD,as directed by  Maximino Greenland, MD while in the presence of Maximino Greenland, MD.  This visit occurred during the SARS-CoV-2 public health emergency.  Safety protocols were in place, including screening questions prior to the visit, additional usage of staff PPE, and extensive cleaning of exam room while observing appropriate contact time as indicated for disinfecting solutions.  Subjective:     Patient ID: Kaitlyn Terry , female    DOB: 09-21-1960 , 59 y.o.   MRN: [id number]   Chief Complaint  Patient presents with  . Annual Exam  . Hypertension    HPI  She is here today for a full physical examination. She is followed by GYN for her pelvic exams. She denies having any headaches, chest pain and shortness of breath.  Hypertension This is a chronic problem. The current episode started more than 1 year ago. The problem has been gradually improving since onset. The problem is controlled. Pertinent negatives include no blurred vision, chest pain, palpitations or shortness of breath. Risk factors for coronary artery disease include obesity and sedentary lifestyle. The current treatment provides moderate improvement. Compliance problems include exercise.      Past Medical History:  Diagnosis Date  . Breast density   . Pelvic pain   . Yeast vaginitis      Family History  Problem Relation Age of Onset  . COPD Mother   . Asthma Mother   . Cancer Father   . Allergies Father      Current Outpatient Medications:  .  amLODipine (NORVASC) 5 MG tablet, TAKE 1 TABLET BY MOUTH EVERY DAY, Disp: 90 tablet, Rfl: 1 .  fluticasone (FLONASE) 50 MCG/ACT nasal spray, Place 1 spray into both nostrils daily., Disp: 15.8 g, Rfl: 1 .  levothyroxine (SYNTHROID) 88 MCG tablet, TAKE 1 TABLET BY MOUTH MON - THUR AND 1/2 TAB ON FRI-SAT (Patient taking differently:  TAKE 1 TABLET BY MOUTH MON - Fri AND 1/2 TAB ON SAT), Disp: 90 tablet, Rfl: 1 .  Vitamin D, Ergocalciferol, (DRISDOL) 1.25 MG (50000 UNIT) CAPS capsule, TAKE 1 CAPSULE BY ORAL ROUTE EVERY WEEK ON TUESDAY AND FRIDAY, Disp: 24 capsule, Rfl: 1 .  albuterol (VENTOLIN HFA) 108 (90 Base) MCG/ACT inhaler, Inhale 2 puffs into the lungs every 6 (six) hours as needed for wheezing or shortness of breath. (Patient not taking: Reported on 09/22/2019), Disp: 18 g, Rfl: 1   Allergies  Allergen Reactions  . Peanuts [Peanut Oil]       The patient states she uses none for birth control. Last LMP was No LMP recorded. Patient has had a hysterectomy.. Negative for Dysmenorrhea. Negative for: breast discharge, breast lump(s), breast pain and breast self exam. Associated symptoms include abnormal vaginal bleeding. Pertinent negatives include abnormal bleeding (hematology), anxiety, decreased libido, depression, difficulty falling sleep, dyspareunia, history of infertility, nocturia, sexual dysfunction, sleep disturbances, urinary incontinence, urinary urgency, vaginal discharge and vaginal itching. Diet regular.The patient states her exercise level is  moderate.   . The patient's tobacco use is:  Social History   Tobacco Use  Smoking Status Never Smoker  Smokeless Tobacco Never Used  . She has been exposed to passive smoke. The patient's alcohol use is:  Social History   Substance and Sexual Activity  Alcohol Use No   Review of Systems  Constitutional: Negative.   HENT: Negative.   Eyes: Negative.  Negative for blurred vision.  Respiratory: Negative.  Negative for shortness of breath.   Cardiovascular: Negative.  Negative for chest pain and palpitations.  Gastrointestinal: Negative.   Endocrine: Negative.   Genitourinary: Negative.   Musculoskeletal: Positive for arthralgias.       She c/o r groin pain. There is some pain with ambulation. Denies fall/trauma. Has been walking on treadmill. Not sure what  could have triggered her sx.   Skin: Negative.   Allergic/Immunologic: Negative.   Neurological: Negative.   Hematological: Negative.   Psychiatric/Behavioral: Negative.      Today's Vitals   03/21/20 0928  BP: 112/70  Pulse: 66  Temp: 97.9 F (36.6 C)  TempSrc: Oral  Weight: 157 lb 3.2 oz (71.3 kg)  Height: 5' 3.6" (1.615 m)   Body mass index is 27.32 kg/m.  Wt Readings from Last 3 Encounters:  03/21/20 157 lb 3.2 oz (71.3 kg)  09/22/19 158 lb 9.6 oz (71.9 kg)  06/20/19 159 lb 8 oz (72.3 kg)   Objective:  Physical Exam Vitals and nursing note reviewed.  Constitutional:      General: She is not in acute distress.    Appearance: Normal appearance. She is well-developed.  HENT:     Head: Normocephalic and atraumatic.     Right Ear: Hearing, ear canal and external ear normal. There is impacted cerumen.     Left Ear: Hearing, ear canal and external ear normal. There is impacted cerumen.     Nose:     Comments: Deferred, masked    Mouth/Throat:     Comments: Deferred, masked Eyes:     General: Lids are normal.     Extraocular Movements: Extraocular movements intact.     Conjunctiva/sclera: Conjunctivae normal.     Pupils: Pupils are equal, round, and reactive to light.     Funduscopic exam:    Right eye: No papilledema.        Left eye: No papilledema.  Neck:     Thyroid: No thyroid mass.     Vascular: No carotid bruit.  Cardiovascular:     Rate and Rhythm: Normal rate and regular rhythm.     Pulses: Normal pulses.     Heart sounds: Normal heart sounds. No murmur heard.   Pulmonary:     Effort: Pulmonary effort is normal.     Breath sounds: Normal breath sounds.  Chest:     Breasts: Tanner Score is 5.        Right: Normal.        Left: Normal.  Abdominal:     General: Bowel sounds are normal. There is no distension.     Palpations: Abdomen is soft.     Tenderness: There is no abdominal tenderness.  Genitourinary:    Comments: Deferred. Musculoskeletal:         General: Tenderness present. No swelling. Normal range of motion.     Cervical back: Full passive range of motion without pain, normal range of motion and neck supple.     Right hip: Tenderness present.     Right lower leg: No edema.     Left lower leg: No edema.     Comments: Pain with movement of right hip  Skin:    General: Skin is warm and dry.     Capillary Refill: Capillary refill takes less than 2 seconds.  Neurological:     General: No focal deficit present.     Mental Status: She is alert and oriented to person,  place, and time.     Cranial Nerves: No cranial nerve deficit.     Sensory: No sensory deficit.  Psychiatric:        Mood and Affect: Mood normal.        Behavior: Behavior normal.        Thought Content: Thought content normal.        Judgment: Judgment normal.         Assessment And Plan:     1. Encounter for annual physical exam Comments: A full exam was performed. Importance of monthly self breast exams was discussed with the patient. PATIENT IS ADVISED TO GET 30-45 MINUTES REGULAR EXERCISE NO LESS THAN FOUR TO FIVE DAYS PER WEEK - BOTH WEIGHTBEARING EXERCISES AND AEROBIC ARE RECOMMENDED.  PATIENT IS ADVISED TO FOLLOW A HEALTHY DIET WITH AT LEAST SIX FRUITS/VEGGIES PER DAY, DECREASE INTAKE OF RED MEAT, AND TO INCREASE FISH INTAKE TO TWO DAYS PER WEEK.  MEATS/FISH SHOULD NOT BE FRIED, BAKED OR BROILED IS PREFERABLE.  I SUGGEST WEARING SPF 50 SUNSCREEN ON EXPOSED PARTS AND ESPECIALLY WHEN IN THE DIRECT SUNLIGHT FOR AN EXTENDED PERIOD OF TIME.  PLEASE AVOID FAST FOOD RESTAURANTS AND INCREASE YOUR WATER INTAKE.  - Hemoglobin A1c - CBC - CMP14+EGFR - Lipid panel - TSH - T4, free  2. Essential hypertension, benign Comments: Chronic, well controlled. She will continue with current meds. She is encouraged to avoid adding salt to her foods. EKG performed, NSR w/o acute changes. She will rto in six months for re-evaluation.   - POCT Urinalysis Dipstick  (81002) - POCT UA - Microalbumin - EKG 12-Lead  3. Rt groin pain Comments: I will refer her to Ortho for further evaluation. She will likely have radiographic studies during this visit.   4. Bilateral impacted cerumen  AFTER OBTAINING VERBAL CONSENT, BOTH EARS WERE FLUSHED BY IRRIGATION. SHE TOLERATED PROCEDURE WELL WITHOUT ANY COMPLICATIONS. NO TM ABNORMALITIES WERE NOTED.    Patient was given opportunity to ask questions. Patient verbalized understanding of the plan and was able to repeat key elements of the plan. All questions were answered to their satisfaction.   Maximino Greenland, MD   I, Maximino Greenland, MD, have reviewed all documentation for this visit. The documentation on 03/29/20 for the exam, diagnosis, procedures, and orders are all accurate and complete.  THE PATIENT IS ENCOURAGED TO PRACTICE SOCIAL DISTANCING DUE TO THE COVID-19 PANDEMIC.

## 2020-03-22 LAB — T4, FREE: Free T4: 1.27 ng/dL (ref 0.82–1.77)

## 2020-03-22 LAB — CMP14+EGFR
ALT: 14 IU/L (ref 0–32)
AST: 15 IU/L (ref 0–40)
Albumin/Globulin Ratio: 1.6 (ref 1.2–2.2)
Albumin: 4.5 g/dL (ref 3.8–4.9)
Alkaline Phosphatase: 126 IU/L — ABNORMAL HIGH (ref 44–121)
BUN/Creatinine Ratio: 8 — ABNORMAL LOW (ref 9–23)
BUN: 7 mg/dL (ref 6–24)
Bilirubin Total: 0.3 mg/dL (ref 0.0–1.2)
CO2: 28 mmol/L (ref 20–29)
Calcium: 9.9 mg/dL (ref 8.7–10.2)
Chloride: 99 mmol/L (ref 96–106)
Creatinine, Ser: 0.84 mg/dL (ref 0.57–1.00)
GFR calc Af Amer: 89 mL/min/{1.73_m2} (ref >59)
GFR calc non Af Amer: 77 mL/min/{1.73_m2} (ref >59)
Globulin, Total: 2.9 g/dL (ref 1.5–4.5)
Glucose: 83 mg/dL (ref 65–99)
Potassium: 4.2 mmol/L (ref 3.5–5.2)
Sodium: 139 mmol/L (ref 134–144)
Total Protein: 7.4 g/dL (ref 6.0–8.5)

## 2020-03-22 LAB — LIPID PANEL
Chol/HDL Ratio: 4.9 ratio — ABNORMAL HIGH (ref 0.0–4.4)
Cholesterol, Total: 224 mg/dL — ABNORMAL HIGH (ref 100–199)
HDL: 46 mg/dL (ref >39)
LDL Chol Calc (NIH): 142 mg/dL — ABNORMAL HIGH (ref 0–99)
Triglycerides: 202 mg/dL — ABNORMAL HIGH (ref 0–149)
VLDL Cholesterol Cal: 36 mg/dL (ref 5–40)

## 2020-03-22 LAB — CBC
Hematocrit: 41 % (ref 34.0–46.6)
Hemoglobin: 13.8 g/dL (ref 11.1–15.9)
MCH: 29.7 pg (ref 26.6–33.0)
MCHC: 33.7 g/dL (ref 31.5–35.7)
MCV: 88 fL (ref 79–97)
Platelets: 305 10*3/uL (ref 150–450)
RBC: 4.65 x10E6/uL (ref 3.77–5.28)
RDW: 13.1 % (ref 11.7–15.4)
WBC: 6.3 10*3/uL (ref 3.4–10.8)

## 2020-03-22 LAB — TSH: TSH: 0.725 u[IU]/mL (ref 0.450–4.500)

## 2020-03-22 LAB — HEMOGLOBIN A1C
Est. average glucose Bld gHb Est-mCnc: 126 mg/dL
Hgb A1c MFr Bld: 6 % — ABNORMAL HIGH (ref 4.8–5.6)

## 2020-03-29 ENCOUNTER — Encounter: Payer: Self-pay | Admitting: Internal Medicine

## 2020-07-24 ENCOUNTER — Other Ambulatory Visit: Payer: Self-pay | Admitting: Internal Medicine

## 2020-08-02 ENCOUNTER — Other Ambulatory Visit: Payer: Self-pay | Admitting: Internal Medicine

## 2020-08-03 ENCOUNTER — Encounter: Payer: Self-pay | Admitting: Internal Medicine

## 2020-09-20 ENCOUNTER — Ambulatory Visit: Payer: BC Managed Care – PPO | Admitting: Internal Medicine

## 2020-09-27 ENCOUNTER — Ambulatory Visit: Payer: BC Managed Care – PPO | Admitting: Internal Medicine

## 2020-10-08 ENCOUNTER — Encounter: Payer: Self-pay | Admitting: Internal Medicine

## 2020-10-08 ENCOUNTER — Other Ambulatory Visit: Payer: Self-pay

## 2020-10-08 ENCOUNTER — Ambulatory Visit (INDEPENDENT_AMBULATORY_CARE_PROVIDER_SITE_OTHER): Payer: BC Managed Care – PPO | Admitting: Internal Medicine

## 2020-10-08 VITALS — BP 130/78 | HR 82 | Temp 98.1°F | Ht 63.0 in | Wt 159.6 lb

## 2020-10-08 DIAGNOSIS — I10 Essential (primary) hypertension: Secondary | ICD-10-CM | POA: Diagnosis not present

## 2020-10-08 DIAGNOSIS — E039 Hypothyroidism, unspecified: Secondary | ICD-10-CM

## 2020-10-08 DIAGNOSIS — L42 Pityriasis rosea: Secondary | ICD-10-CM

## 2020-10-08 DIAGNOSIS — E663 Overweight: Secondary | ICD-10-CM | POA: Diagnosis not present

## 2020-10-08 DIAGNOSIS — Z6827 Body mass index (BMI) 27.0-27.9, adult: Secondary | ICD-10-CM

## 2020-10-08 MED ORDER — TRIAMCINOLONE ACETONIDE 0.1 % EX CREA: 1.0000 "application " | TOPICAL_CREAM | Freq: Two times a day (BID) | CUTANEOUS | 0 refills | Status: DC | PRN

## 2020-10-08 MED ORDER — HYDROXYZINE HCL 10 MG PO TABS: 10.0000 mg | ORAL_TABLET | Freq: Three times a day (TID) | ORAL | 0 refills | Status: DC | PRN

## 2020-10-08 NOTE — Progress Notes (Signed)
Earleen Newport as a Education administrator for Maximino Greenland, MD.,have documented all relevant documentation on the behalf of Maximino Greenland, MD,as directed by  Maximino Greenland, MD while in the presence of Maximino Greenland, MD. This visit occurred during the SARS-CoV-2 public health emergency.  Safety protocols were in place, including screening questions prior to the visit, additional usage of staff PPE, and extensive cleaning of exam room while observing appropriate contact time as indicated for disinfecting solutions.  Subjective:     Patient ID: Kaitlyn Terry , female    DOB: 11-Mar-1961 , 60 y.o.   MRN: [id number]   Chief Complaint  Patient presents with  . Hypertension    HPI  She is here today for a BP check. Pt has a concern about breakouts on arm and in between thighs, she said those areas were itchy. Started about a week ago, she has been using cortisone cream and wiping the areas with alcohol.  Hypertension This is a chronic problem. The current episode started more than 1 year ago. The problem has been gradually improving since onset. The problem is controlled. Pertinent negatives include no blurred vision, chest pain, palpitations or shortness of breath. Risk factors for coronary artery disease include obesity and sedentary lifestyle. The current treatment provides moderate improvement. Compliance problems include exercise.      Past Medical History:  Diagnosis Date  . Breast density   . Pelvic pain   . Yeast vaginitis      Family History  Problem Relation Age of Onset  . COPD Mother   . Asthma Mother   . Cancer Father   . Allergies Father      Current Outpatient Medications:  .  amLODipine (NORVASC) 5 MG tablet, TAKE 1 TABLET BY MOUTH EVERY DAY, Disp: 90 tablet, Rfl: 1 .  hydrOXYzine (ATARAX/VISTARIL) 10 MG tablet, Take 1 tablet (10 mg total) by mouth 3 (three) times daily as needed., Disp: 30 tablet, Rfl: 0 .  levothyroxine (SYNTHROID) 88 MCG tablet, TAKE 1 TABLET BY  MOUTH MON - Fri AND 1/2 TAB ON SAT, Disp: 78 tablet, Rfl: 2 .  triamcinolone cream (KENALOG) 0.1 %, Apply 1 application topically 2 (two) times daily as needed., Disp: 30 g, Rfl: 0 .  Vitamin D, Ergocalciferol, (DRISDOL) 1.25 MG (50000 UNIT) CAPS capsule, TAKE 1 CAPSULE BY ORAL ROUTE EVERY WEEK ON TUESDAY AND FRIDAY, Disp: 24 capsule, Rfl: 1 .  albuterol (VENTOLIN HFA) 108 (90 Base) MCG/ACT inhaler, Inhale 2 puffs into the lungs every 6 (six) hours as needed for wheezing or shortness of breath. (Patient not taking: No sig reported), Disp: 18 g, Rfl: 1 .  fluticasone (FLONASE) 50 MCG/ACT nasal spray, Place 1 spray into both nostrils daily., Disp: 15.8 g, Rfl: 1   Allergies  Allergen Reactions  . Peanuts [Peanut Oil]      Review of Systems  Constitutional: Negative.   Eyes: Negative for blurred vision.  Respiratory: Negative.  Negative for shortness of breath.   Cardiovascular: Negative.  Negative for chest pain and palpitations.  Skin: Positive for rash.       She c/o itchy rash on her trunk/arms. Denies use of new detergents/moisturizers/soaps.   Neurological: Negative.   Psychiatric/Behavioral: Negative.      Today's Vitals   10/08/20 1625  BP: 130/78  Pulse: 82  Temp: 98.1 F (36.7 C)  SpO2: 99%  Weight: 159 lb 9.6 oz (72.4 kg)  Height: _0  (1.6 m)   Body mass index  is 28.27 kg/m.   Objective:  Physical Exam Vitals and nursing note reviewed.  Constitutional:      Appearance: Normal appearance.  HENT:     Head: Normocephalic and atraumatic.     Nose:     Comments: Masked     Mouth/Throat:     Comments: Masked  Cardiovascular:     Rate and Rhythm: Normal rate and regular rhythm.     Heart sounds: Normal heart sounds.  Pulmonary:     Effort: Pulmonary effort is normal.     Breath sounds: Normal breath sounds.  Musculoskeletal:     Cervical back: Normal range of motion.  Skin:    General: Skin is warm.     Comments: Scattered maculopapular rash on b/l UE  extremities, trunk. Appears to be in linear pattern on her limbs.   Neurological:     General: No focal deficit present.     Mental Status: She is alert.  Psychiatric:        Mood and Affect: Mood normal.        Behavior: Behavior normal.         Assessment And Plan:     1. Essential hypertension, benign Comments: Chronic, controlled. She is encouraged to follow low sodium diet. She will c/w amlodipine 21m daily.  - CMP14+EGFR  2. Primary hypothyroidism Comments: I will check thyroid panel and adjust meds as needed. She will rto in six months for re-evaluation.  - TSH - T4, free  3. Pityriasis rosea Comments: I will send rx hydroxyzine to use prn itching. I will also send rx triamcinolone cream to apply to affected areas bid up to 14 days.   4. Overweight with body mass index (BMI) of 27 to 27.9 in adult Comments: She is encouraged to aim for at least 150 minutes of exercise per week.    Patient was given opportunity to ask questions. Patient verbalized understanding of the plan and was able to repeat key elements of the plan. All questions were answered to their satisfaction.   I, RMaximino Greenland MD, have reviewed all documentation for this visit. The documentation on 10/08/20 for the exam, diagnosis, procedures, and orders are all accurate and complete.   IF YOU HAVE BEEN REFERRED TO A SPECIALIST, IT MAY TAKE 1-2 WEEKS TO SCHEDULE/PROCESS THE REFERRAL. IF YOU HAVE NOT HEARD FROM US/SPECIALIST IN TWO WEEKS, PLEASE GIVE [place] CALL AT [phone].   THE PATIENT IS ENCOURAGED TO PRACTICE SOCIAL DISTANCING DUE TO THE COVID-19 PANDEMIC.

## 2020-10-09 LAB — CMP14+EGFR
ALT: 12 IU/L (ref 0–32)
AST: 15 IU/L (ref 0–40)
Albumin/Globulin Ratio: 1.7 (ref 1.2–2.2)
Albumin: 4.5 g/dL (ref 3.8–4.9)
Alkaline Phosphatase: 122 IU/L — ABNORMAL HIGH (ref 44–121)
BUN/Creatinine Ratio: 16 (ref 9–23)
BUN: 15 mg/dL (ref 6–24)
Bilirubin Total: 0.3 mg/dL (ref 0.0–1.2)
CO2: 27 mmol/L (ref 20–29)
Calcium: 10.1 mg/dL (ref 8.7–10.2)
Chloride: 103 mmol/L (ref 96–106)
Creatinine, Ser: 0.94 mg/dL (ref 0.57–1.00)
Globulin, Total: 2.7 g/dL (ref 1.5–4.5)
Glucose: 85 mg/dL (ref 65–99)
Potassium: 4.7 mmol/L (ref 3.5–5.2)
Sodium: 143 mmol/L (ref 134–144)
Total Protein: 7.2 g/dL (ref 6.0–8.5)
eGFR: 70 mL/min/{1.73_m2} (ref >59)

## 2020-10-09 LAB — T4, FREE: Free T4: 1.16 ng/dL (ref 0.82–1.77)

## 2020-10-09 LAB — TSH: TSH: 0.915 u[IU]/mL (ref 0.450–4.500)

## 2020-10-10 ENCOUNTER — Encounter: Payer: Self-pay | Admitting: Internal Medicine

## 2020-10-31 ENCOUNTER — Encounter: Payer: Self-pay | Admitting: Internal Medicine

## 2021-01-27 ENCOUNTER — Other Ambulatory Visit: Payer: Self-pay | Admitting: Internal Medicine

## 2021-03-27 ENCOUNTER — Other Ambulatory Visit: Payer: Self-pay

## 2021-03-27 ENCOUNTER — Encounter: Payer: Self-pay | Admitting: Internal Medicine

## 2021-03-27 ENCOUNTER — Ambulatory Visit (INDEPENDENT_AMBULATORY_CARE_PROVIDER_SITE_OTHER): Payer: BC Managed Care – PPO | Admitting: Internal Medicine

## 2021-03-27 VITALS — BP 126/80 | HR 84 | Temp 98.1°F | Ht 63.0 in | Wt 162.4 lb

## 2021-03-27 DIAGNOSIS — H6123 Impacted cerumen, bilateral: Secondary | ICD-10-CM | POA: Diagnosis not present

## 2021-03-27 DIAGNOSIS — Z0000 Encounter for general adult medical examination without abnormal findings: Secondary | ICD-10-CM

## 2021-03-27 DIAGNOSIS — I10 Essential (primary) hypertension: Secondary | ICD-10-CM

## 2021-03-27 DIAGNOSIS — R0982 Postnasal drip: Secondary | ICD-10-CM

## 2021-03-27 DIAGNOSIS — R7309 Other abnormal glucose: Secondary | ICD-10-CM

## 2021-03-27 DIAGNOSIS — E039 Hypothyroidism, unspecified: Secondary | ICD-10-CM

## 2021-03-27 LAB — POCT URINALYSIS DIPSTICK
Glucose, UA: NEGATIVE
Leukocytes, UA: NEGATIVE
Nitrite, UA: NEGATIVE
Protein, UA: NEGATIVE
Spec Grav, UA: 1.025 (ref 1.010–1.025)
Urobilinogen, UA: 1 E.U./dL
pH, UA: 6.5 (ref 5.0–8.0)

## 2021-03-27 LAB — POCT UA - MICROALBUMIN
Albumin/Creatinine Ratio, Urine, POC: <30
Creatinine, POC: 300 mg/dL
Microalbumin Ur, POC: 30 mg/L

## 2021-03-27 NOTE — Progress Notes (Signed)
I,Katawbba Wiggins,acting as a Education administrator for Maximino Greenland, MD.,have documented all relevant documentation on the behalf of Maximino Greenland, MD,as directed by  Maximino Greenland, MD while in the presence of Maximino Greenland, MD.  This visit occurred during the SARS-CoV-2 public health emergency.  Safety protocols were in place, including screening questions prior to the visit, additional usage of staff PPE, and extensive cleaning of exam room while observing appropriate contact time as indicated for disinfecting solutions.  Subjective:     Patient ID: Kaitlyn Terry , female    DOB: 11/18/1960 , 60 y.o.   MRN: [id number]   Chief Complaint  Patient presents with   Annual Exam   Hypertension     HPI  She is here today for a full physical examination. She is followed by GYN for her pelvic exams. Last pap was in February along with her mammogram.  She reports compliance with meds.   Hypertension This is a chronic problem. The current episode started more than 1 year ago. The problem has been gradually improving since onset. The problem is controlled. Pertinent negatives include no blurred vision, chest pain, palpitations or shortness of breath. Risk factors for coronary artery disease include obesity and sedentary lifestyle. The current treatment provides moderate improvement. Compliance problems include exercise.     Past Medical History:  Diagnosis Date   Breast density    Pelvic pain    Yeast vaginitis      Family History  Problem Relation Age of Onset   COPD Mother    Asthma Mother    Cancer Father    Allergies Father      Current Outpatient Medications:    albuterol (VENTOLIN HFA) 108 (90 Base) MCG/ACT inhaler, Inhale 2 puffs into the lungs every 6 (six) hours as needed for wheezing or shortness of breath. (Patient not taking: No sig reported), Disp: 18 g, Rfl: 1   amLODipine (NORVASC) 5 MG tablet, TAKE 1 TABLET BY MOUTH EVERY DAY, Disp: 90 tablet, Rfl: 1   azithromycin  (ZITHROMAX) 250 MG tablet, Take 2 tablets (500 mg) on  Day 1,  followed by 1 tablet (250 mg) once daily on Days 2 through 5., Disp: 6 each, Rfl: 0   fluticasone (FLONASE) 50 MCG/ACT nasal spray, Place 1 spray into both nostrils daily., Disp: 15.8 g, Rfl: 1   HYDROcodone bit-homatropine (HYCODAN) 5-1.5 MG/5ML syrup, Take 5 mLs by mouth every 6 (six) hours as needed for cough., Disp: 120 mL, Rfl: 0   hydrOXYzine (ATARAX/VISTARIL) 10 MG tablet, Take 1 tablet (10 mg total) by mouth 3 (three) times daily as needed., Disp: 30 tablet, Rfl: 0   levothyroxine (SYNTHROID) 88 MCG tablet, TAKE 1 TABLET BY MOUTH MON - Fri AND 1/2 TAB ON SAT, Disp: 78 tablet, Rfl: 2   triamcinolone cream (KENALOG) 0.1 %, Apply 1 application topically 2 (two) times daily as needed., Disp: 30 g, Rfl: 0   Vitamin D, Ergocalciferol, (DRISDOL) 1.25 MG (50000 UNIT) CAPS capsule, TAKE 1 CAPSULE BY ORAL ROUTE EVERY WEEK ON TUESDAY AND FRIDAY, Disp: 24 capsule, Rfl: 1   Allergies  Allergen Reactions   Peanuts [Peanut Oil]       The patient states she uses post menopausal status for birth control. Last LMP was No LMP recorded. Patient has had a hysterectomy.. Negative for Dysmenorrhea. Negative for: breast discharge, breast lump(s), breast pain and breast self exam. Associated symptoms include abnormal vaginal bleeding. Pertinent negatives include abnormal bleeding (hematology), anxiety, decreased libido,  depression, difficulty falling sleep, dyspareunia, history of infertility, nocturia, sexual dysfunction, sleep disturbances, urinary incontinence, urinary urgency, vaginal discharge and vaginal itching. Diet regular.The patient states her exercise level is  intermittent, twice weekly.   . The patient's tobacco use is:  Social History   Tobacco Use  Smoking Status Never  Smokeless Tobacco Never  . She has been exposed to passive smoke. The patient's alcohol use is:  Social History   Substance and Sexual Activity  Alcohol Use No     Review of Systems  Constitutional: Negative.   HENT:  Positive for postnasal drip.   Eyes: Negative.  Negative for blurred vision.  Respiratory:  Positive for cough. Negative for shortness of breath.   Cardiovascular: Negative.  Negative for chest pain and palpitations.  Gastrointestinal: Negative.   Endocrine: Negative.   Genitourinary: Negative.   Musculoskeletal: Negative.   Skin: Negative.   Allergic/Immunologic: Negative.   Neurological: Negative.   Hematological: Negative.   Psychiatric/Behavioral: Negative.      Today's Vitals   03/27/21 1446  BP: 126/80  Pulse: 84  Temp: 98.1 F (36.7 C)  SpO2: 98%  Weight: 162 lb 6.4 oz (73.7 kg)  Height: '5\' 3"'  (1.6 m)   Body mass index is 28.77 kg/m.  Wt Readings from Last 3 Encounters:  03/27/21 162 lb 6.4 oz (73.7 kg)  10/08/20 159 lb 9.6 oz (72.4 kg)  03/21/20 157 lb 3.2 oz (71.3 kg)    BP Readings from Last 3 Encounters:  03/27/21 126/80  10/08/20 130/78  03/21/20 112/70    Objective:  Physical Exam Vitals and nursing note reviewed.  Constitutional:      Appearance: Normal appearance.  HENT:     Head: Normocephalic and atraumatic.     Right Ear: Ear canal and external ear normal. There is impacted cerumen.     Left Ear: Ear canal and external ear normal. There is impacted cerumen.     Nose:     Comments: Masked     Mouth/Throat:     Mouth: Mucous membranes are moist.     Pharynx: Oropharynx is clear. No oropharyngeal exudate.     Comments: Slightly erythematous pharynx Eyes:     Extraocular Movements: Extraocular movements intact.     Conjunctiva/sclera: Conjunctivae normal.     Pupils: Pupils are equal, round, and reactive to light.  Cardiovascular:     Rate and Rhythm: Normal rate and regular rhythm.     Pulses: Normal pulses.     Heart sounds: Normal heart sounds.  Pulmonary:     Effort: Pulmonary effort is normal.     Breath sounds: Normal breath sounds.  Chest:  Breasts:    Tanner Score is 5.      Right: Normal.     Left: Normal.  Abdominal:     General: Bowel sounds are normal.     Palpations: Abdomen is soft.  Genitourinary:    Comments: deferred Musculoskeletal:        General: Normal range of motion.     Cervical back: Normal range of motion and neck supple.  Skin:    General: Skin is warm and dry.  Neurological:     General: No focal deficit present.     Mental Status: She is alert and oriented to person, place, and time.  Psychiatric:        Mood and Affect: Mood normal.        Behavior: Behavior normal.        Assessment And  Plan:     1. Encounter for annual physical exam Comments: A full exam was performed. Importance of monthly self breast exams was discussed with the patient. She will c/w pelvic exams w/ GYN. PATIENT IS ADVISED TO GET 30-45 MINUTES REGULAR EXERCISE NO LESS THAN FOUR TO FIVE DAYS PER WEEK - BOTH WEIGHTBEARING EXERCISES AND AEROBIC ARE RECOMMENDED.  PATIENT IS ADVISED TO FOLLOW A HEALTHY DIET WITH AT LEAST SIX FRUITS/VEGGIES PER DAY, DECREASE INTAKE OF RED MEAT, AND TO INCREASE FISH INTAKE TO TWO DAYS PER WEEK.  MEATS/FISH SHOULD NOT BE FRIED, BAKED OR BROILED IS PREFERABLE.  IT IS ALSO IMPORTANT TO CUT BACK ON YOUR SUGAR INTAKE. PLEASE AVOID ANYTHING WITH ADDED SUGAR, CORN SYRUP OR OTHER SWEETENERS. IF YOU MUST USE A SWEETENER, YOU CAN TRY STEVIA. IT IS ALSO IMPORTANT TO AVOID ARTIFICIALLY SWEETENERS AND DIET BEVERAGES. LASTLY, I SUGGEST WEARING SPF 50 SUNSCREEN ON EXPOSED PARTS AND ESPECIALLY WHEN IN THE DIRECT SUNLIGHT FOR AN EXTENDED PERIOD OF TIME.  PLEASE AVOID FAST FOOD RESTAURANTS AND INCREASE YOUR WATER INTAKE.  - Hemoglobin A1c - CBC - CMP14+EGFR - Lipid panel  2. Essential hypertension, benign Comments: Chronic, controlled. EKG performed, NSR w/o acute changes. She is encouraged to follow low sodium diet. She will f/u in six months for re-evaluation.  - POCT Urinalysis Dipstick (81002) - POCT UA - Microalbumin - EKG 12-Lead  3.  Primary hypothyroidism Comments: Chronic, I will check thyroid panel and adjust meds as needed.  - TSH + free T4  4. Postnasal drip Comments: She was given samples of Zyrtec, 81m to take nightly. Advised to cut back on her dairy (cheese) intake. She will let me know if her sx persist.  5. Bilateral impacted cerumen Comments: AFTER OBTAINING VERBAL CONSENT, BOTH EARS WERE FLUSHED BY IRRIGATION. SHE TOLERATED PROCEDURE WELL WITHOUT ANY COMPLICATIONS. NO TM ABNORMALITIES WERE NOTED.   6. Other abnormal glucose Comments: Her a1c has been elevated in the past. I will recheck this today. Encouraged to limit her intake of sweetened beverages, including diet drinks.  - Hemoglobin A1c  Patient was given opportunity to ask questions. Patient verbalized understanding of the plan and was able to repeat key elements of the plan. All questions were answered to their satisfaction.   I, RMaximino Greenland MD, have reviewed all documentation for this visit. The documentation on 03/27/21 for the exam, diagnosis, procedures, and orders are all accurate and complete.   THE PATIENT IS ENCOURAGED TO PRACTICE SOCIAL DISTANCING DUE TO THE COVID-19 PANDEMIC.

## 2021-03-27 NOTE — Patient Instructions (Signed)
Health Maintenance, Female Adopting a healthy lifestyle and getting preventive care are important in promoting health and wellness. Ask your health care provider about: The right schedule for you to have regular tests and exams. Things you can do on your own to prevent diseases and keep yourself healthy. What should I know about diet, weight, and exercise? Eat a healthy diet  Eat a diet that includes plenty of vegetables, fruits, low-fat dairy products, and lean protein. Do not eat a lot of foods that are high in solid fats, added sugars, or sodium. Maintain a healthy weight Body mass index (BMI) is used to identify weight problems. It estimates body fat based on height and weight. Your health care provider can help determine your BMI and help you achieve or maintain a healthy weight. Get regular exercise Get regular exercise. This is one of the most important things you can do for your health. Most adults should: Exercise for at least 150 minutes each week. The exercise should increase your heart rate and make you sweat (moderate-intensity exercise). Do strengthening exercises at least twice a week. This is in addition to the moderate-intensity exercise. Spend less time sitting. Even light physical activity can be beneficial. Watch cholesterol and blood lipids Have your blood tested for lipids and cholesterol at 59 years of age, then have this test every 5 years. Have your cholesterol levels checked more often if: Your lipid or cholesterol levels are high. You are older than 59 years of age. You are at high risk for heart disease. What should I know about cancer screening? Depending on your health history and family history, you may need to have cancer screening at various ages. This may include screening for: Breast cancer. Cervical cancer. Colorectal cancer. Skin cancer. Lung cancer. What should I know about heart disease, diabetes, and high blood pressure? Blood pressure and heart  disease High blood pressure causes heart disease and increases the risk of stroke. This is more likely to develop in people who have high blood pressure readings, are of African descent, or are overweight. Have your blood pressure checked: Every 3-5 years if you are 18-39 years of age. Every year if you are 40 years old or older. Diabetes Have regular diabetes screenings. This checks your fasting blood sugar level. Have the screening done: Once every three years after age 40 if you are at a normal weight and have a low risk for diabetes. More often and at a younger age if you are overweight or have a high risk for diabetes. What should I know about preventing infection? Hepatitis B If you have a higher risk for hepatitis B, you should be screened for this virus. Talk with your health care provider to find out if you are at risk for hepatitis B infection. Hepatitis C Testing is recommended for: Everyone born from 1945 through 1965. Anyone with known risk factors for hepatitis C. Sexually transmitted infections (STIs) Get screened for STIs, including gonorrhea and chlamydia, if: You are sexually active and are younger than 59 years of age. You are older than 59 years of age and your health care provider tells you that you are at risk for this type of infection. Your sexual activity has changed since you were last screened, and you are at increased risk for chlamydia or gonorrhea. Ask your health care provider if you are at risk. Ask your health care provider about whether you are at high risk for HIV. Your health care provider may recommend a prescription medicine   to help prevent HIV infection. If you choose to take medicine to prevent HIV, you should first get tested for HIV. You should then be tested every 3 months for as long as you are taking the medicine. Pregnancy If you are about to stop having your period (premenopausal) and you may become pregnant, seek counseling before you get  pregnant. Take 400 to 800 micrograms (mcg) of folic acid every day if you become pregnant. Ask for birth control (contraception) if you want to prevent pregnancy. Osteoporosis and menopause Osteoporosis is a disease in which the bones lose minerals and strength with aging. This can result in bone fractures. If you are 65 years old or older, or if you are at risk for osteoporosis and fractures, ask your health care provider if you should: Be screened for bone loss. Take a calcium or vitamin D supplement to lower your risk of fractures. Be given hormone replacement therapy (HRT) to treat symptoms of menopause. Follow these instructions at home: Lifestyle Do not use any products that contain nicotine or tobacco, such as cigarettes, e-cigarettes, and chewing tobacco. If you need help quitting, ask your health care provider. Do not use street drugs. Do not share needles. Ask your health care provider for help if you need support or information about quitting drugs. Alcohol use Do not drink alcohol if: Your health care provider tells you not to drink. You are pregnant, may be pregnant, or are planning to become pregnant. If you drink alcohol: Limit how much you use to 0-1 drink a day. Limit intake if you are breastfeeding. Be aware of how much alcohol is in your drink. In the [place] one drink equals one 12 oz bottle of beer (355 mL), one 5 oz glass of wine (148 mL), or one 1 oz glass of hard liquor (44 mL). General instructions Schedule regular health, dental, and eye exams. Stay current with your vaccines. Tell your health care provider if: You often feel depressed. You have ever been abused or do not feel safe at home. Summary Adopting a healthy lifestyle and getting preventive care are important in promoting health and wellness. Follow your health care provider's instructions about healthy diet, exercising, and getting tested or screened for diseases. Follow your health care provider's  instructions on monitoring your cholesterol and blood pressure. This information is not intended to replace advice given to you by your health care provider. Make sure you discuss any questions you have with your health care provider. Document Revised: 08/03/2020 Document Reviewed: 05/19/2018 [employer] Patient Education  2022 [employer]  

## 2021-03-28 LAB — CMP14+EGFR
ALT: 11 IU/L (ref 0–32)
AST: 16 IU/L (ref 0–40)
Albumin/Globulin Ratio: 1.7 (ref 1.2–2.2)
Albumin: 4.4 g/dL (ref 3.8–4.9)
Alkaline Phosphatase: 120 IU/L (ref 44–121)
BUN/Creatinine Ratio: 9 (ref 9–23)
BUN: 9 mg/dL (ref 6–24)
Bilirubin Total: 0.3 mg/dL (ref 0.0–1.2)
CO2: 28 mmol/L (ref 20–29)
Calcium: 9.6 mg/dL (ref 8.7–10.2)
Chloride: 99 mmol/L (ref 96–106)
Creatinine, Ser: 0.96 mg/dL (ref 0.57–1.00)
Globulin, Total: 2.6 g/dL (ref 1.5–4.5)
Glucose: 87 mg/dL (ref 70–99)
Potassium: 4.1 mmol/L (ref 3.5–5.2)
Sodium: 140 mmol/L (ref 134–144)
Total Protein: 7 g/dL (ref 6.0–8.5)
eGFR: 68 mL/min/{1.73_m2} (ref >59)

## 2021-03-28 LAB — CBC
Hematocrit: 41.7 % (ref 34.0–46.6)
Hemoglobin: 13.8 g/dL (ref 11.1–15.9)
MCH: 29.1 pg (ref 26.6–33.0)
MCHC: 33.1 g/dL (ref 31.5–35.7)
MCV: 88 fL (ref 79–97)
Platelets: 302 10*3/uL (ref 150–450)
RBC: 4.74 x10E6/uL (ref 3.77–5.28)
RDW: 13.3 % (ref 11.7–15.4)
WBC: 8.2 10*3/uL (ref 3.4–10.8)

## 2021-03-28 LAB — TSH+FREE T4
Free T4: 1.34 ng/dL (ref 0.82–1.77)
TSH: 1.13 u[IU]/mL (ref 0.450–4.500)

## 2021-03-28 LAB — LIPID PANEL
Chol/HDL Ratio: 5 ratio — ABNORMAL HIGH (ref 0.0–4.4)
Cholesterol, Total: 232 mg/dL — ABNORMAL HIGH (ref 100–199)
HDL: 46 mg/dL (ref >39)
LDL Chol Calc (NIH): 155 mg/dL — ABNORMAL HIGH (ref 0–99)
Triglycerides: 168 mg/dL — ABNORMAL HIGH (ref 0–149)
VLDL Cholesterol Cal: 31 mg/dL (ref 5–40)

## 2021-03-28 LAB — HEMOGLOBIN A1C
Est. average glucose Bld gHb Est-mCnc: 126 mg/dL
Hgb A1c MFr Bld: 6 % — ABNORMAL HIGH (ref 4.8–5.6)

## 2021-03-29 ENCOUNTER — Other Ambulatory Visit: Payer: Self-pay | Admitting: Internal Medicine

## 2021-03-29 ENCOUNTER — Encounter: Payer: Self-pay | Admitting: Internal Medicine

## 2021-03-29 MED ORDER — AZITHROMYCIN 250 MG PO TABS: ORAL_TABLET | ORAL | 0 refills | Status: AC

## 2021-03-29 MED ORDER — HYDROCODONE BIT-HOMATROP MBR 5-1.5 MG/5ML PO SOLN: 5.0000 mL | Freq: Four times a day (QID) | ORAL | 0 refills | Status: DC | PRN

## 2021-05-30 ENCOUNTER — Other Ambulatory Visit: Payer: Self-pay | Admitting: Internal Medicine

## 2021-07-08 DIAGNOSIS — M25562 Pain in left knee: Secondary | ICD-10-CM | POA: Insufficient documentation

## 2021-08-12 LAB — HM MAMMOGRAPHY: HM Mammogram: NORMAL (ref 0–4)

## 2021-09-30 ENCOUNTER — Encounter: Payer: Self-pay | Admitting: Internal Medicine

## 2021-09-30 ENCOUNTER — Ambulatory Visit: Payer: BC Managed Care – PPO | Admitting: Internal Medicine

## 2021-09-30 VITALS — BP 116/80 | HR 67 | Temp 98.2°F | Ht 63.8 in | Wt 162.0 lb

## 2021-09-30 DIAGNOSIS — Z23 Encounter for immunization: Secondary | ICD-10-CM

## 2021-09-30 DIAGNOSIS — E039 Hypothyroidism, unspecified: Secondary | ICD-10-CM

## 2021-09-30 DIAGNOSIS — M79642 Pain in left hand: Secondary | ICD-10-CM

## 2021-09-30 DIAGNOSIS — Z6827 Body mass index (BMI) 27.0-27.9, adult: Secondary | ICD-10-CM

## 2021-09-30 DIAGNOSIS — M79641 Pain in right hand: Secondary | ICD-10-CM | POA: Diagnosis not present

## 2021-09-30 DIAGNOSIS — I10 Essential (primary) hypertension: Secondary | ICD-10-CM | POA: Diagnosis not present

## 2021-09-30 DIAGNOSIS — R7309 Other abnormal glucose: Secondary | ICD-10-CM

## 2021-09-30 DIAGNOSIS — M25562 Pain in left knee: Secondary | ICD-10-CM

## 2021-09-30 DIAGNOSIS — G8929 Other chronic pain: Secondary | ICD-10-CM

## 2021-09-30 NOTE — Progress Notes (Signed)
?Kaitlyn Terry,acting as a Education administrator for Kaitlyn Greenland, MD.,have documented all relevant documentation on the behalf of Kaitlyn Greenland, MD,as directed by  Kaitlyn Greenland, MD while in the presence of Kaitlyn Greenland, MD.  ?This visit occurred during the SARS-CoV-2 public health emergency.  Safety protocols were in place, including screening questions prior to the visit, additional usage of staff PPE, and extensive cleaning of exam room while observing appropriate contact time as indicated for disinfecting solutions. ? ?Subjective:  ?  ? Patient ID: Kaitlyn Terry , female    DOB: 1961-03-07 , 61 y.o.   MRN: [id number] ? ? ?Chief Complaint  ?Patient presents with  ? Hypertension  ? ? ?HPI ? ?She is here today for a BP check. She reports compliance with meds. She denies headaches, chest pain and shortness of breath. ? ?Hypertension ?This is a chronic problem. The current episode started more than 1 year ago. The problem has been gradually improving since onset. The problem is controlled. Pertinent negatives include no blurred vision, chest pain, palpitations or shortness of breath. Risk factors for coronary artery disease include obesity and sedentary lifestyle. The current treatment provides moderate improvement. Compliance problems include exercise.    ? ?Past Medical History:  ?Diagnosis Date  ? Breast density   ? Pelvic pain   ? Yeast vaginitis   ?  ? ?Family History  ?Problem Relation Age of Onset  ? COPD Mother   ? Asthma Mother   ? Cancer Father   ? Allergies Father   ? ? ? ?Current Outpatient Medications:  ?  amLODipine (NORVASC) 5 MG tablet, TAKE 1 TABLET BY MOUTH EVERY DAY, Disp: 90 tablet, Rfl: 1 ?  fluticasone (FLONASE) 50 MCG/ACT nasal spray, Place 1 spray into both nostrils daily., Disp: 15.8 g, Rfl: 1 ?  levothyroxine (SYNTHROID) 88 MCG tablet, TAKE 1 TABLET BY MOUTH MON - FRI AND 1/2 TAB ON SAT, Disp: 78 tablet, Rfl: 2 ?  Vitamin D, Ergocalciferol, (DRISDOL) 1.25 MG (50000 UNIT) CAPS capsule,  TAKE 1 CAPSULE BY ORAL ROUTE EVERY WEEK ON TUESDAY AND FRIDAY, Disp: 24 capsule, Rfl: 1 ?  albuterol (VENTOLIN HFA) 108 (90 Base) MCG/ACT inhaler, Inhale 2 puffs into the lungs every 6 (six) hours as needed for wheezing or shortness of breath. (Patient not taking: Reported on 09/30/2021), Disp: 18 g, Rfl: 1 ?  HYDROcodone bit-homatropine (HYCODAN) 5-1.5 MG/5ML syrup, Take 5 mLs by mouth every 6 (six) hours as needed for cough. (Patient not taking: Reported on 09/30/2021), Disp: 120 mL, Rfl: 0 ?  hydrOXYzine (ATARAX/VISTARIL) 10 MG tablet, Take 1 tablet (10 mg total) by mouth 3 (three) times daily as needed. (Patient not taking: Reported on 09/30/2021), Disp: 30 tablet, Rfl: 0 ?  triamcinolone cream (KENALOG) 0.1 %, Apply 1 application topically 2 (two) times daily as needed. (Patient not taking: Reported on 09/30/2021), Disp: 30 g, Rfl: 0  ? ?Allergies  ?Allergen Reactions  ? Peanuts [Peanut Oil]   ?  ? ? Marital status: Married. Relevant history for alcohol use is:  ?Social History  ? ?Substance and Sexual Activity  ?Alcohol Use No  ?. Relevant history for tobacco use is:  ?Social History  ? ?Tobacco Use  ?Smoking Status Never  ?Smokeless Tobacco Never  ?.  ? ?Review of Systems  ?Constitutional: Negative.   ?Eyes:  Negative for blurred vision.  ?Respiratory: Negative.  Negative for shortness of breath.   ?Cardiovascular: Negative.  Negative for chest pain and palpitations.  ?Gastrointestinal:  Negative.   ?Musculoskeletal:  Positive for arthralgias.  ?     She c/o left knee pain, b/l hand pain L>R. Denies fall/trauma. Joints feel more stiff  ?Neurological: Negative.   ?Psychiatric/Behavioral: Negative.     ? ?Today's Vitals  ? 09/30/21 1535  ?BP: 116/80  ?Pulse: 67  ?Temp: 98.2 ?F (36.8 ?C)  ?Weight: 162 lb (73.5 kg)  ?Height: 5' 3.8" (1.621 m)  ?PainSc: 4   ?PainLoc: Finger  ? ?Body mass index is 27.98 kg/m?.  ?Wt Readings from Last 3 Encounters:  ?09/30/21 162 lb (73.5 kg)  ?03/27/21 162 lb 6.4 oz (73.7 kg)  ?10/08/20  159 lb 9.6 oz (72.4 kg)  ?  ? ?Objective:  ?Physical Exam ?Vitals and nursing note reviewed.  ?Constitutional:   ?   Appearance: Normal appearance.  ?HENT:  ?   Head: Normocephalic and atraumatic.  ?Eyes:  ?   Extraocular Movements: Extraocular movements intact.  ?Cardiovascular:  ?   Rate and Rhythm: Normal rate and regular rhythm.  ?   Heart sounds: Normal heart sounds.  ?Pulmonary:  ?   Effort: Pulmonary effort is normal.  ?   Breath sounds: Normal breath sounds.  ?Musculoskeletal:  ?   Cervical back: Normal range of motion.  ?Skin: ?   General: Skin is warm.  ?Neurological:  ?   General: No focal deficit present.  ?   Mental Status: She is alert.  ?Psychiatric:     ?   Mood and Affect: Mood normal.     ?   Behavior: Behavior normal.  ?   ?Assessment And Plan:  ?  ?1. Essential hypertension, benign ?Comments: Chronic, well controlled. No med changes. Encouraged to follow low sodium diet. I will check renal function. She will f/ uin 6 months for CPE.  ?- CMP14+EGFR ? ?2. Primary hypothyroidism ?Comments: I will check thyroid panel and adjust meds as needed.  ?- TSH + free T4 ? ?3. Other abnormal glucose ?Comments: Her a1c has been elevated in the past.  She is encouraged to limit her intake of sweetened beverages, including diet drinks.  ?- Hemoglobin A1c ? ?4. Bilateral hand pain ?Comments: Pos squeeze test b/l. I will check arthritis panel and make further recommendations once her labs are available for review.  ?- ANA, IFA (with reflex) ?- CYCLIC CITRUL PEPTIDE ANTIBODY, IGG/IGA ?- Rheumatoid factor ?- Sedimentation rate ?- Uric acid ? ?5. Chronic pain of left knee ?Comments: She is advised to apply Voltaren gel to affected area bid-tid prn. If persistent, will refer to Ortho for further radiographic evaluation.  ?- Vitamin D (25 hydroxy) ? ?6. BMI 27.0-27.9,adult ?Comments: She is encouraged to aim for at least 150 minutes of exercise per week.  ? ?7. Immunization due ?- Varicella-zoster vaccine IM  (Shingrix) ? ? ?Patient was given opportunity to ask questions. Patient verbalized understanding of the plan and was able to repeat key elements of the plan. All questions were answered to their satisfaction.  ? ?I, Kaitlyn Greenland, MD, have reviewed all documentation for this visit. The documentation on 09/30/21 for the exam, diagnosis, procedures, and orders are all accurate and complete.  ? ?THE PATIENT IS ENCOURAGED TO PRACTICE SOCIAL DISTANCING DUE TO THE COVID-19 PANDEMIC.   ?

## 2021-09-30 NOTE — Patient Instructions (Addendum)
The 10-year ASCVD risk score (Arnett DK, et al., 2019) is: 11.9% ?  Values used to calculate the score: ?    Age: 61 years ?    Sex: Female ?    Is Non-Hispanic African American: Yes ?    Diabetic: No ?    Tobacco smoker: Yes ?    Systolic Blood Pressure: 116 mmHg ?    Is BP treated: Yes ?    HDL Cholesterol: 46 mg/dL ?    Total Cholesterol: 232 mg/dL ? ? ?Hypertension, Adult ?Hypertension is another name for high blood pressure. High blood pressure forces your heart to work harder to pump blood. This can cause problems over time. ?There are two numbers in a blood pressure reading. There is a top number (systolic) over a bottom number (diastolic). It is best to have a blood pressure that is below 120/80. ?What are the causes? ?The cause of this condition is not known. Some other conditions can lead to high blood pressure. ?What increases the risk? ?Some lifestyle factors can make you more likely to develop high blood pressure: ?Smoking. ?Not getting enough exercise or physical activity. ?Being overweight. ?Having too much fat, sugar, calories, or salt (sodium) in your diet. ?Drinking too much alcohol. ?Other risk factors include: ?Having any of these conditions: ?Heart disease. ?Diabetes. ?High cholesterol. ?Kidney disease. ?Obstructive sleep apnea. ?Having a family history of high blood pressure and high cholesterol. ?Age. The risk increases with age. ?Stress. ?What are the signs or symptoms? ?High blood pressure may not cause symptoms. Very high blood pressure (hypertensive crisis) may cause: ?Headache. ?Fast or uneven heartbeats (palpitations). ?Shortness of breath. ?Nosebleed. ?Vomiting or feeling like you may vomit (nauseous). ?Changes in how you see. ?Very bad chest pain. ?Feeling dizzy. ?Seizures. ?How is this treated? ?This condition is treated by making healthy lifestyle changes, such as: ?Eating healthy foods. ?Exercising more. ?Drinking less alcohol. ?Your doctor may prescribe medicine if lifestyle  changes do not help enough and if: ?Your top number is above 130. ?Your bottom number is above 80. ?Your personal target blood pressure may vary. ?Follow these instructions at home: ?Eating and drinking ? ?If told, follow the DASH eating plan. To follow this plan: ?Fill one half of your plate at each meal with fruits and vegetables. ?Fill one fourth of your plate at each meal with whole grains. Whole grains include whole-wheat pasta, brown rice, and whole-grain bread. ?Eat or drink low-fat dairy products, such as skim milk or low-fat yogurt. ?Fill one fourth of your plate at each meal with low-fat (lean) proteins. Low-fat proteins include fish, chicken without skin, eggs, beans, and tofu. ?Avoid fatty meat, cured and processed meat, or chicken with skin. ?Avoid pre-made or processed food. ?Limit the amount of salt in your diet to less than 1,500 mg each day. ?Do not drink alcohol if: ?Your doctor tells you not to drink. ?You are pregnant, may be pregnant, or are planning to become pregnant. ?If you drink alcohol: ?Limit how much you have to: ?0-1 drink a day for women. ?0-2 drinks a day for men. ?Know how much alcohol is in your drink. In the [place] one drink equals one 12 oz bottle of beer (355 mL), one 5 oz glass of wine (148 mL), or one 1? oz glass of hard liquor (44 mL). ?Lifestyle ? ?Work with your doctor to stay at a healthy weight or to lose weight. Ask your doctor what the best weight is for you. ?Get at least 30 minutes of  exercise that causes your heart to beat faster (aerobic exercise) most days of the week. This may include walking, swimming, or biking. ?Get at least 30 minutes of exercise that strengthens your muscles (resistance exercise) at least 3 days a week. This may include lifting weights or doing Pilates. ?Do not smoke or use any products that contain nicotine or tobacco. If you need help quitting, ask your doctor. ?Check your blood pressure at home as told by your doctor. ?Keep all follow-up  visits. ?Medicines ?Take over-the-counter and prescription medicines only as told by your doctor. Follow directions carefully. ?Do not skip doses of blood pressure medicine. The medicine does not work as well if you skip doses. Skipping doses also puts you at risk for problems. ?Ask your doctor about side effects or reactions to medicines that you should watch for. ?Contact a doctor if: ?You think you are having a reaction to the medicine you are taking. ?You have headaches that keep coming back. ?You feel dizzy. ?You have swelling in your ankles. ?You have trouble with your vision. ?Get help right away if: ?You get a very bad headache. ?You start to feel mixed up (confused). ?You feel weak or numb. ?You feel faint. ?You have very bad pain in your: ?Chest. ?Belly (abdomen). ?You vomit more than once. ?You have trouble breathing. ?These symptoms may be an emergency. Get help right away. Call 911. ?Do not wait to see if the symptoms will go away. ?Do not drive yourself to the hospital. ?Summary ?Hypertension is another name for high blood pressure. ?High blood pressure forces your heart to work harder to pump blood. ?For most people, a normal blood pressure is less than 120/80. ?Making healthy choices can help lower blood pressure. If your blood pressure does not get lower with healthy choices, you may need to take medicine. ?This information is not intended to replace advice given to you by your health care provider. Make sure you discuss any questions you have with your health care provider. ?Document Revised: 03/14/2021 Document Reviewed: 03/14/2021 ?Elsevier Patient Education ? 2023 Elsevier Inc. ? ?

## 2021-10-02 ENCOUNTER — Other Ambulatory Visit: Payer: Self-pay | Admitting: Internal Medicine

## 2021-10-02 LAB — CMP14+EGFR
ALT: 14 IU/L (ref 0–32)
AST: 17 IU/L (ref 0–40)
Albumin/Globulin Ratio: 1.2 (ref 1.2–2.2)
Albumin: 4.1 g/dL (ref 3.8–4.9)
Alkaline Phosphatase: 119 IU/L (ref 44–121)
BUN/Creatinine Ratio: 11 (ref 9–23)
BUN: 14 mg/dL (ref 6–24)
Bilirubin Total: <0.2 mg/dL (ref 0.0–1.2)
CO2: 25 mmol/L (ref 20–29)
Calcium: 9.6 mg/dL (ref 8.7–10.2)
Chloride: 101 mmol/L (ref 96–106)
Creatinine, Ser: 1.29 mg/dL — ABNORMAL HIGH (ref 0.57–1.00)
Globulin, Total: 3.4 g/dL (ref 1.5–4.5)
Glucose: 74 mg/dL (ref 70–99)
Potassium: 4.3 mmol/L (ref 3.5–5.2)
Sodium: 144 mmol/L (ref 134–144)
Total Protein: 7.5 g/dL (ref 6.0–8.5)
eGFR: 48 mL/min/{1.73_m2} — ABNORMAL LOW (ref >59)

## 2021-10-02 LAB — ANTINUCLEAR ANTIBODIES, IFA: ANA Titer 1: NEGATIVE

## 2021-10-02 LAB — URIC ACID: Uric Acid: 5.6 mg/dL (ref 3.0–7.2)

## 2021-10-02 LAB — RHEUMATOID FACTOR: Rheumatoid fact SerPl-aCnc: <10 IU/mL (ref <14.0)

## 2021-10-02 LAB — TSH+FREE T4
Free T4: 1.27 ng/dL (ref 0.82–1.77)
TSH: 0.701 u[IU]/mL (ref 0.450–4.500)

## 2021-10-02 LAB — CYCLIC CITRUL PEPTIDE ANTIBODY, IGG/IGA: Cyclic Citrullin Peptide Ab: 2 units (ref 0–19)

## 2021-10-02 LAB — VITAMIN D 25 HYDROXY (VIT D DEFICIENCY, FRACTURES): Vit D, 25-Hydroxy: 11 ng/mL — ABNORMAL LOW (ref 30.0–100.0)

## 2021-10-02 LAB — HEMOGLOBIN A1C
Est. average glucose Bld gHb Est-mCnc: 126 mg/dL
Hgb A1c MFr Bld: 6 % — ABNORMAL HIGH (ref 4.8–5.6)

## 2021-10-02 LAB — SEDIMENTATION RATE: Sed Rate: 56 mm/hr — ABNORMAL HIGH (ref 0–40)

## 2021-10-02 MED ORDER — VITAMIN D (ERGOCALCIFEROL) 1.25 MG (50000 UNIT) PO CAPS: ORAL_CAPSULE | ORAL | 1 refills | Status: AC

## 2021-10-14 ENCOUNTER — Other Ambulatory Visit: Payer: Self-pay | Admitting: Internal Medicine

## 2021-11-18 ENCOUNTER — Encounter: Payer: Self-pay | Admitting: Internal Medicine

## 2021-12-04 ENCOUNTER — Telehealth: Payer: BC Managed Care – PPO | Admitting: Physician Assistant

## 2021-12-04 DIAGNOSIS — M546 Pain in thoracic spine: Secondary | ICD-10-CM

## 2021-12-04 NOTE — Progress Notes (Signed)
Because you have a medical form that needs completed, I feel your condition warrants further evaluation and I recommend that you be seen in a face to face visit.   NOTE: There will be NO CHARGE for this eVisit   If you are having a true medical emergency please call 911.      For an urgent face to face visit, [place] has seven urgent care centers for your convenience:     [facility] at [facility] Directions [phone] [address] [place], [place] [address]    [facility]) Get Driving Directions [phone] [address] [place], [place] [address]  [facility]) Get Driving Directions [phone] [address] [place],  [place]  [address]  [facility] - at [facility] Directions  [phone] [address] [place],  [place] [address]   [facility] at [facility] Get Driving Directions [phone] [address] [place], [place] [address]   [facility] at [facility] Get Driving Directions  [phone] [address] [place], [place] [address]   [facility] at [facility] Directions [phone] [address], [address] [place], [place] [address]  Your MyChart E-visit questionnaire answers were reviewed by a board certified advanced clinical practitioner to complete your personal care plan based on your specific symptoms.  Thank you for using e-Visits.   I provided 5 minutes of non face-to-face time during this encounter for chart review and documentation.

## 2021-12-05 ENCOUNTER — Encounter: Payer: Self-pay | Admitting: Internal Medicine

## 2021-12-05 ENCOUNTER — Ambulatory Visit (INDEPENDENT_AMBULATORY_CARE_PROVIDER_SITE_OTHER): Payer: BC Managed Care – PPO | Admitting: Internal Medicine

## 2021-12-05 ENCOUNTER — Other Ambulatory Visit: Payer: Self-pay | Admitting: Internal Medicine

## 2021-12-05 VITALS — BP 120/70 | HR 68 | Temp 98.2°F | Ht 63.8 in | Wt 157.6 lb

## 2021-12-05 DIAGNOSIS — M542 Cervicalgia: Secondary | ICD-10-CM

## 2021-12-05 DIAGNOSIS — M25562 Pain in left knee: Secondary | ICD-10-CM | POA: Diagnosis not present

## 2021-12-05 DIAGNOSIS — I10 Essential (primary) hypertension: Secondary | ICD-10-CM | POA: Diagnosis not present

## 2021-12-05 DIAGNOSIS — Z6827 Body mass index (BMI) 27.0-27.9, adult: Secondary | ICD-10-CM

## 2021-12-05 MED ORDER — MELOXICAM 7.5 MG/5ML PO SUSP: 7.5000 mg | Freq: Every day | ORAL | 1 refills | Status: DC | PRN

## 2021-12-05 NOTE — Patient Instructions (Addendum)
Dr. Drue [facility] [place] [facility]  Organic dandelion root tea, add cinnamon Ginger/turmeric tea Increase intake cruciferous veggies - broccoli, cabbage, brussel sprouts, cauliflower Golden milk   Chronic Knee Pain, Adult Chronic knee pain is pain in one or both knees that lasts longer than 3 months. Symptoms of chronic knee pain may include swelling, stiffness, and discomfort. Age-related wear and tear (osteoarthritis) of the knee joint is the most common cause of chronic knee pain. Other possible causes include: A long-term immune-related disease that causes inflammation of the knee (rheumatoid arthritis). This usually affects both knees. Inflammatory arthritis, such as gout or pseudogout. An injury to the knee that causes arthritis. An injury to the knee that damages the ligaments. Ligaments are strong tissues that connect bones to each other. Runner's knee or pain behind the kneecap. Treatment for chronic knee pain depends on the cause. The main treatments for chronic knee pain are physical therapy and weight loss. This condition may also be treated with medicines, injections, a knee sleeve or brace, and by using crutches. Rest, ice, pressure (compression), and elevation, also known as RICE therapy, may also be recommended. Follow these instructions at home: If you have a knee sleeve or brace:  Wear the knee sleeve or brace as told by your health care provider. Remove it only as told by your health care provider. Loosen it if your toes tingle, become numb, or turn cold and blue. Keep it clean. If the sleeve or brace is not waterproof: Do not let it get wet. Remove it if allowed by your health care provider, or cover it with a watertight covering when you take a bath or a shower. Managing pain, stiffness, and swelling     If directed, apply heat to the affected area as often as told by your health care provider. Use the heat source that your health care  provider recommends, such as a moist heat pack or a heating pad. If you have a removable knee sleeve or brace, remove it as told by your health care provider. Place a towel between your skin and the heat source. Leave the heat on for 20-30 minutes. Remove the heat if your skin turns bright red. This is especially important if you are unable to feel pain, heat, or cold. You may have a greater risk of getting burned. If directed, put ice on the affected area. To do this: If you have a removable knee sleeve or brace, remove it as told by your health care provider. Put ice in a plastic bag. Place a towel between your skin and the bag. Leave the ice on for 20 minutes, 2-3 times a day. Remove the ice if your skin turns bright red. This is very important. If you cannot feel pain, heat, or cold, you have a greater risk of damage to the area. Move your toes often to reduce stiffness and swelling. Raise (elevate) the injured area above the level of your heart while you are sitting or lying down. Activity Avoid high-impact activities or exercises, such as running, jumping rope, or doing jumping jacks. Follow the exercise plan that your health care provider designed for you. Your health care provider may suggest that you: Avoid activities that make knee pain worse. This may require you to change your exercise routines, sport participation, or job duties. Wear shoes with cushioned soles. Avoid sports that require running and sudden changes in direction. Do physical therapy. Physical therapy is planned to match your needs and abilities. It may  include exercises for strength, flexibility, stability, and endurance. Do exercises that increase balance and strength, such as tai chi and yoga. Do not use the injured limb to support your body weight until your health care provider says that you can. Use crutches as told by your health care provider. Return to your normal activities as told by your health care  provider. Ask your health care provider what activities are safe for you. General instructions Take over-the-counter and prescription medicines only as told by your health care provider. Lose weight if you are overweight. Losing even a little weight can reduce knee pain. Ask your health care provider what your ideal weight is, and how to safely lose extra weight. A dietitian may be able to help you plan your meals. Do not use any products that contain nicotine or tobacco, such as cigarettes, e-cigarettes, and chewing tobacco. These can delay healing. If you need help quitting, ask your health care provider. Keep all follow-up visits. This is important. Contact a health care provider if: You have knee pain that is not getting better or gets worse. You are unable to do your physical therapy exercises due to knee pain. Get help right away if: Your knee swells and the swelling becomes worse. You cannot move your knee. You have severe knee pain. Summary Knee pain that lasts more than 3 months is considered chronic knee pain. The main treatments for chronic knee pain are physical therapy and weight loss. You may also need to take medicines, wear a knee sleeve or brace, use crutches, and apply ice or heat. Losing even a little weight can reduce knee pain. Ask your health care provider what your ideal weight is, and how to safely lose extra weight. A dietitian may be able to help you plan your meals. Follow the exercise plan that your health care provider designed for you. This information is not intended to replace advice given to you by your health care provider. Make sure you discuss any questions you have with your health care provider. Document Revised: 11/09/2019 Document Reviewed: 11/09/2019 Elsevier Patient Education  [place].

## 2021-12-05 NOTE — Progress Notes (Signed)
Jeri Cos [place],acting as a Neurosurgeon for [name], MD.,have documented all relevant documentation on the behalf of [name], MD,as directed by  [name], MD while in the presence of [name], MD.  This visit occurred during the SARS-CoV-2 public health emergency.  Safety protocols were in place, including screening questions prior to the visit, additional usage of staff PPE, and extensive cleaning of exam room while observing appropriate contact time as indicated for disinfecting solutions.  Subjective:     Patient ID: Kaitlyn Terry , female    DOB: Dec 08, 1960 , 61 y.o.   MRN: [id number]   Chief Complaint  Patient presents with   Hypertension    HPI  She is here today for a BP check. She reports compliance with meds. She denies headaches, chest pain and shortness of breath.   Hypertension This is a chronic problem. The current episode started more than 1 year ago. The problem has been gradually improving since onset. The problem is controlled. Associated symptoms include neck pain. Pertinent negatives include no blurred vision, chest pain, palpitations or shortness of breath. Risk factors for coronary artery disease include obesity and sedentary lifestyle. The current treatment provides moderate improvement. Compliance problems include exercise.      Past Medical History:  Diagnosis Date   Breast density    Pelvic pain    Yeast vaginitis      Family History  Problem Relation Age of Onset   COPD Mother    Asthma Mother    Cancer Father    Allergies Father      Current Outpatient Medications:    amLODipine (NORVASC) 5 MG tablet, TAKE 1 TABLET BY MOUTH EVERY DAY, Disp: 90 tablet, Rfl: 1   fluticasone (FLONASE) 50 MCG/ACT nasal spray, Place 1 spray into both nostrils daily., Disp: 15.8 g, Rfl: 1   levothyroxine (SYNTHROID) 88 MCG tablet, TAKE 1 TABLET BY MOUTH MON - FRI AND 1/2 TAB ON SAT, Disp: 78 tablet, Rfl: 2   Meloxicam 7.5 MG/5ML SUSP, Take 5  mLs (7.5 mg total) by mouth daily as needed., Disp: 30 mL, Rfl: 1   Vitamin D, Ergocalciferol, (DRISDOL) 1.25 MG (50000 UNIT) CAPS capsule, TAKE 1 CAPSULE BY ORAL ROUTE once weekly, Disp: 12 capsule, Rfl: 1   albuterol (VENTOLIN HFA) 108 (90 Base) MCG/ACT inhaler, Inhale 2 puffs into the lungs every 6 (six) hours as needed for wheezing or shortness of breath. (Patient not taking: Reported on 09/30/2021), Disp: 18 g, Rfl: 1   Allergies  Allergen Reactions   Peanuts [Peanut Oil]      Review of Systems  Constitutional: Negative.   Eyes:  Negative for blurred vision.  Respiratory: Negative.  Negative for shortness of breath.   Cardiovascular: Negative.  Negative for chest pain and palpitations.  Gastrointestinal: Negative.   Musculoskeletal:  Positive for arthralgias and neck pain.       She has left knee pain. Also with neck pain. Denies fall/trauma. Denies UE weakness/paresthesias.   Neurological: Negative.   Psychiatric/Behavioral: Negative.       Today's Vitals   12/05/21 1451  BP: 120/70  Pulse: 68  Temp: 98.2 F (36.8 C)  Weight: 157 lb 9.6 oz (71.5 kg)  Height: 5' 3.8" (1.621 m)  PainSc: 3   PainLoc: Knee   Body mass index is 27.22 kg/m.  Wt Readings from Last 3 Encounters:  12/05/21 157 lb 9.6 oz (71.5 kg)  09/30/21 162 lb (73.5 kg)  03/27/21 162 lb  6.4 oz (73.7 kg)     Objective:  Physical Exam Vitals and nursing note reviewed.  Constitutional:      Appearance: Normal appearance.  HENT:     Head: Normocephalic and atraumatic.  Eyes:     Extraocular Movements: Extraocular movements intact.  Neck:     Vascular: No carotid bruit.  Cardiovascular:     Rate and Rhythm: Normal rate and regular rhythm.     Heart sounds: Normal heart sounds.  Pulmonary:     Effort: Pulmonary effort is normal.     Breath sounds: Normal breath sounds.  Musculoskeletal:        General: Tenderness present.     Cervical back: Normal range of motion. Tenderness present.   Lymphadenopathy:     Cervical: No cervical adenopathy.  Skin:    General: Skin is warm.  Neurological:     General: No focal deficit present.     Mental Status: She is alert.  Psychiatric:        Mood and Affect: Mood normal.        Behavior: Behavior normal.      Assessment And Plan:     1. Essential hypertension, benign Comments: Chronic, well controlled. She will rto as previously scheduled for CPE in October 2023.   2. Acute pain of left knee Comments: She plans to go to [facility] for further evaluation. I will send rx meloxicam to take prn. Advised to take w /food and sparingly.   3. Cervicalgia Comments: She would likely benefit from stretching exercises. I will refer her to [facility] for further evaluation.  - Ambulatory referral to Chiropractic  4. BMI 27.0-27.9,adult Comments: She is encouraged to ai for at least 150 minutes of exercise per week.    Patient was given opportunity to ask questions. Patient verbalized understanding of the plan and was able to repeat key elements of the plan. All questions were answered to their satisfaction.   I, [name], MD, have reviewed all documentation for this visit. The documentation on 12/05/21 for the exam, diagnosis, procedures, and orders are all accurate and complete.   IF YOU HAVE BEEN REFERRED TO A SPECIALIST, IT MAY TAKE 1-2 WEEKS TO SCHEDULE/PROCESS THE REFERRAL. IF YOU HAVE NOT HEARD FROM US/SPECIALIST IN TWO WEEKS, PLEASE GIVE [place] A CALL AT [phone].   THE PATIENT IS ENCOURAGED TO PRACTICE SOCIAL DISTANCING DUE TO THE COVID-19 PANDEMIC.

## 2021-12-18 ENCOUNTER — Ambulatory Visit: Payer: BC Managed Care – PPO | Admitting: Internal Medicine

## 2021-12-31 ENCOUNTER — Ambulatory Visit: Payer: BC Managed Care – PPO

## 2022-01-07 ENCOUNTER — Ambulatory Visit (INDEPENDENT_AMBULATORY_CARE_PROVIDER_SITE_OTHER): Payer: BC Managed Care – PPO

## 2022-01-07 VITALS — BP 128/64 | HR 67 | Temp 98.3°F

## 2022-01-07 DIAGNOSIS — Z23 Encounter for immunization: Secondary | ICD-10-CM | POA: Diagnosis not present

## 2022-01-07 NOTE — Progress Notes (Signed)
Patient presents today for 2nd shingles vaccine.  ?

## 2022-03-11 ENCOUNTER — Other Ambulatory Visit: Payer: Self-pay | Admitting: Internal Medicine

## 2022-04-01 ENCOUNTER — Telehealth: Payer: BC Managed Care – PPO | Admitting: Nurse Practitioner

## 2022-04-01 DIAGNOSIS — R102 Pelvic and perineal pain: Secondary | ICD-10-CM | POA: Insufficient documentation

## 2022-04-01 DIAGNOSIS — J069 Acute upper respiratory infection, unspecified: Secondary | ICD-10-CM

## 2022-04-01 MED ORDER — FLUTICASONE PROPIONATE 50 MCG/ACT NA SUSP: 2.0000 | Freq: Every day | NASAL | 6 refills | Status: DC

## 2022-04-01 NOTE — Progress Notes (Signed)
E-Visit for Sinus Problems  We are sorry that you are not feeling well.  Here is how we plan to help!  Based on what you have shared with me it looks like you have sinusitis.  Sinusitis is inflammation and infection in the sinus cavities of the head.  Based on your presentation I believe you most likely have Acute Viral Sinusitis.This is an infection most likely caused by a virus. One virus that you can test for at home that causes similar symptoms is COVID. You can purchase home COVID tests at most pharmacies.   Providers prescribe antibiotics to treat infections caused by bacteria. Antibiotics are very powerful in treating bacterial infections when they are used properly. To maintain their effectiveness, they should be used only when necessary. Overuse of antibiotics has resulted in the development of superbugs that are resistant to treatment!    After careful review of your answers, I would not recommend an antibiotic for your condition.  Antibiotics are not effective against viruses and therefore should not be used to treat them. Common examples of infections caused by viruses include colds and flu   There is not specific treatment for viral sinusitis other than to help you with the symptoms until the infection runs its course.  You may use an oral decongestant such as Mucinex D or if you have glaucoma or high blood pressure use plain Mucinex. Saline nasal spray help and can safely be used as often as needed for congestion, I have prescribed: Fluticasone nasal spray two sprays in each nostril once a day  Some authorities believe that zinc sprays or the use of Echinacea may shorten the course of your symptoms.  Sinus infections are not as easily transmitted as other respiratory infection, however we still recommend that you avoid close contact with loved ones, especially the very young and elderly.  Remember to wash your hands thoroughly throughout the day as this is the number one way to prevent the  spread of infection!  Home Care: Only take medications as instructed by your medical team. Do not take these medications with alcohol. A steam or ultrasonic humidifier can help congestion.  You can place a towel over your head and breathe in the steam from hot water coming from a faucet. Avoid close contacts especially the very young and the elderly. Cover your mouth when you cough or sneeze. Always remember to wash your hands.  Get Help Right Away If: You develop worsening fever or sinus pain. You develop a severe head ache or visual changes. Your symptoms persist after you have completed your treatment plan.  Make sure you Understand these instructions. Will watch your condition. Will get help right away if you are not doing well or get worse.   Thank you for choosing an e-visit.  Your e-visit answers were reviewed by a board certified advanced clinical practitioner to complete your personal care plan. Depending upon the condition, your plan could have included both over the counter or prescription medications.  Please review your pharmacy choice. Make sure the pharmacy is open so you can pick up prescription now. If there is a problem, you may contact your provider through [employer] and have the prescription routed to another pharmacy.  Your safety is important to [place]. If you have drug allergies check your prescription carefully.   For the next 24 hours you can use MyChart to ask questions about today's visit, request a non-urgent call back, or ask for a work or school excuse. You  will get an email in the next two days asking about your experience. I hope that your e-visit has been valuable and will speed your recovery.    Meds ordered this encounter  Medications   fluticasone (FLONASE) 50 MCG/ACT nasal spray    Sig: Place 2 sprays into both nostrils daily.    Dispense:  16 g    Refill:  6    I spent approximately 5 minutes reviewing the patient's history, current  symptoms and coordinating their plan of care today.

## 2022-04-03 ENCOUNTER — Encounter: Payer: Self-pay | Admitting: Internal Medicine

## 2022-04-03 ENCOUNTER — Ambulatory Visit (INDEPENDENT_AMBULATORY_CARE_PROVIDER_SITE_OTHER): Payer: BC Managed Care – PPO | Admitting: Internal Medicine

## 2022-04-03 VITALS — BP 124/72 | HR 78 | Temp 98.1°F | Ht 63.0 in | Wt 159.0 lb

## 2022-04-03 DIAGNOSIS — E039 Hypothyroidism, unspecified: Secondary | ICD-10-CM | POA: Diagnosis not present

## 2022-04-03 DIAGNOSIS — I10 Essential (primary) hypertension: Secondary | ICD-10-CM | POA: Diagnosis not present

## 2022-04-03 DIAGNOSIS — J0100 Acute maxillary sinusitis, unspecified: Secondary | ICD-10-CM

## 2022-04-03 HISTORY — DX: Acute maxillary sinusitis, unspecified: J01.00

## 2022-04-03 MED ORDER — AMOXICILLIN-POT CLAVULANATE 875-125 MG PO TABS: 1.0000 | ORAL_TABLET | Freq: Two times a day (BID) | ORAL | 0 refills | Status: DC

## 2022-04-03 MED ORDER — TRIAMCINOLONE ACETONIDE 40 MG/ML IJ SUSP
40.0000 mg | Freq: Once | INTRAMUSCULAR | Status: AC
  Administered 2022-04-03: 40 mg via INTRAMUSCULAR

## 2022-04-03 NOTE — Progress Notes (Signed)
Barnet Glasgow Martin,acting as a Education administrator for Maximino Greenland, MD.,have documented all relevant documentation on the behalf of Maximino Greenland, MD,as directed by  Maximino Greenland, MD while in the presence of Maximino Greenland, MD.    Subjective:     Patient ID: Kaitlyn Terry , female    DOB: [birth year] , 61 y.o.   MRN: [id number]   Chief Complaint  Patient presents with   URI    HPI  Patient presents today for evaluation of sinus congestion and facial pain. Patient states her whole face hurts, denies runny nose but has been sniffling, and has had headaches.  She states her gums hurt as well. Her sx originally started last weekend, but worsened on Monday.   She was recently seen by e-visit and prescribed a nasal spray. She has not had any improvement in her sx w/ nasal spray. She is traveling to her HS reunion tomorrow and wants to feel better quickly.   BP READINGS from Last 3 Encounters: 04/03/22 : 124/72 01/07/22 : 128/64 12/05/21 : 120/70    Hypertension This is a chronic problem. The current episode started more than 1 year ago. The problem has been gradually improving since onset. The problem is controlled. Associated symptoms include headaches. Pertinent negatives include no blurred vision, chest pain, palpitations or shortness of breath. Risk factors for coronary artery disease include obesity and sedentary lifestyle. The current treatment provides moderate improvement. Compliance problems include exercise.      Past Medical History:  Diagnosis Date   Breast density    Pelvic pain    Yeast vaginitis      Family History  Problem Relation Age of Onset   COPD Mother    Asthma Mother    Cancer Father    Allergies Father      Current Outpatient Medications:    amLODipine (NORVASC) 5 MG tablet, TAKE 1 TABLET BY MOUTH EVERY DAY, Disp: 90 tablet, Rfl: 1   amoxicillin-clavulanate (AUGMENTIN) 875-125 MG tablet, Take 1 tablet by mouth 2 (two) times daily., Disp: 20 tablet, Rfl: 0    fluticasone (FLONASE) 50 MCG/ACT nasal spray, Place 1 spray into both nostrils daily., Disp: 15.8 g, Rfl: 1   fluticasone (FLONASE) 50 MCG/ACT nasal spray, Place 2 sprays into both nostrils daily., Disp: 16 g, Rfl: 6   SYNTHROID 88 MCG tablet, TAKE 1 TABLET BY MOUTH MON - FRI AND 1/2 TAB ON SAT, Disp: 78 tablet, Rfl: 2   Vitamin D, Ergocalciferol, (DRISDOL) 1.25 MG (50000 UNIT) CAPS capsule, TAKE 1 CAPSULE BY ORAL ROUTE once weekly, Disp: 12 capsule, Rfl: 1   albuterol (VENTOLIN HFA) 108 (90 Base) MCG/ACT inhaler, Inhale 2 puffs into the lungs every 6 (six) hours as needed for wheezing or shortness of breath. (Patient not taking: Reported on 09/30/2021), Disp: 18 g, Rfl: 1   Meloxicam 7.5 MG/5ML SUSP, Take 5 mLs (7.5 mg total) by mouth daily as needed. (Patient not taking: Reported on 04/03/2022), Disp: 30 mL, Rfl: 1   Allergies  Allergen Reactions   Peanuts [Peanut Oil]      Review of Systems  Constitutional:  Positive for fatigue.  HENT:  Positive for congestion.   Eyes: Negative.  Negative for blurred vision.  Respiratory: Negative.  Negative for shortness of breath.   Cardiovascular: Negative.  Negative for chest pain and palpitations.  Gastrointestinal: Negative.   Neurological:  Positive for headaches.     Today's Vitals   04/03/22 1510  BP: 124/72  Pulse:  78  Temp: 98.1 F (36.7 C)  TempSrc: Oral  Weight: 159 lb (72.1 kg)  Height: '5\' 3"'  (1.6 m)  PainSc: 4    Body mass index is 28.17 kg/m.  Wt Readings from Last 3 Encounters:  04/03/22 159 lb (72.1 kg)  12/05/21 157 lb 9.6 oz (71.5 kg)  09/30/21 162 lb (73.5 kg)    Objective:  Physical Exam Vitals and nursing note reviewed.  Constitutional:      Appearance: Normal appearance.  HENT:     Head: Normocephalic and atraumatic.     Nose:     Comments: Masked     Mouth/Throat:     Comments: Masked  Eyes:     Extraocular Movements: Extraocular movements intact.  Cardiovascular:     Rate and Rhythm: Normal rate and  regular rhythm.     Heart sounds: Normal heart sounds.  Pulmonary:     Effort: Pulmonary effort is normal.     Breath sounds: Normal breath sounds.  Musculoskeletal:     Cervical back: Normal range of motion.  Skin:    General: Skin is warm.  Neurological:     General: No focal deficit present.     Mental Status: She is alert.  Psychiatric:        Mood and Affect: Mood normal.        Behavior: Behavior normal.     Assessment And Plan:     1. Acute non-recurrent maxillary sinusitis Comments: She was given Kenalog, 82m IMx1. I will send rx Augmentin, advised to take full course. Rapid COVID did not result properly, she agrees to PCR COVID testing. - Novel Coronavirus, NAA ([facility]) - triamcinolone acetonide (KENALOG-40) injection 40 mg  2. Essential hypertension, benign Comments: Chronic, well controlled. No need to adjust meds.  - CMP14+EGFR  3. Primary hypothyroidism Comments: Chronic, I will check thyroid panel and adjust meds as needed.  - TSH + free T4   Patient was given opportunity to ask questions. Patient verbalized understanding of the plan and was able to repeat key elements of the plan. All questions were answered to their satisfaction.   I, RMaximino Greenland MD, have reviewed all documentation for this visit. The documentation on 04/03/22 for the exam, diagnosis, procedures, and orders are all accurate and complete.   IF YOU HAVE BEEN REFERRED TO A SPECIALIST, IT MAY TAKE 1-2 WEEKS TO SCHEDULE/PROCESS THE REFERRAL. IF YOU HAVE NOT HEARD FROM US/SPECIALIST IN TWO WEEKS, PLEASE GIVE [place] CALL AT [phone].   THE PATIENT IS ENCOURAGED TO PRACTICE SOCIAL DISTANCING DUE TO THE COVID-19 PANDEMIC.

## 2022-04-03 NOTE — Patient Instructions (Addendum)
Sinus Infection, Adult A sinus infection is soreness and swelling (inflammation) of your sinuses. Sinuses are hollow spaces in the bones around your face. They are located: Around your eyes. In the middle of your forehead. Behind your nose. In your cheekbones. Your sinuses and nasal passages are lined with a fluid called mucus. Mucus drains out of your sinuses. Swelling can trap mucus in your sinuses. This lets germs (bacteria, virus, or fungus) grow, which leads to infection. Most of the time, this condition is caused by a virus. What are the causes? Allergies. Asthma. Germs. Things that block your nose or sinuses. Growths in the nose (nasal polyps). Chemicals or irritants in the air. A fungus. This is rare. What increases the risk? Having a weak body defense system (immune system). Doing a lot of swimming or diving. Using nasal sprays too much. Smoking. What are the signs or symptoms? The main symptoms of this condition are pain and a feeling of pressure around the sinuses. Other symptoms include: Stuffy nose (congestion). This may make it hard to breathe through your nose. Runny nose (drainage). Soreness, swelling, and warmth in the sinuses. A cough that may get worse at night. Being unable to smell and taste. Mucus that collects in the throat or the back of the nose (postnasal drip). This may cause a sore throat or bad breath. Being very tired (fatigued). A fever. How is this diagnosed? Your symptoms. Your medical history. A physical exam. Tests to find out if your condition is short-term (acute) or long-term (chronic). Your doctor may: Check your nose for growths (polyps). Check your sinuses using a tool that has a light on one end (endoscope). Check for allergies or germs. Do imaging tests, such as an MRI or CT scan. How is this treated? Treatment for this condition depends on the cause and whether it is short-term or long-term. If caused by a virus, your symptoms  should go away on their own within 10 days. You may be given medicines to relieve symptoms. They include: Medicines that shrink swollen tissue in the nose. A spray that treats swelling of the nostrils. Rinses that help get rid of thick mucus in your nose (nasal saline washes). Medicines that treat allergies (antihistamines). Over-the-counter pain relievers. If caused by bacteria, your doctor may wait to see if you will get better without treatment. You may be given antibiotic medicine if you have: A very bad infection. A weak body defense system. If caused by growths in the nose, surgery may be needed. Follow these instructions at home: Medicines Take, use, or apply over-the-counter and prescription medicines only as told by your doctor. These may include nasal sprays. If you were prescribed an antibiotic medicine, take it as told by your doctor. Do not stop taking it even if you start to feel better. Hydrate and humidify  Drink enough water to keep your pee (urine) pale yellow. Use a cool mist humidifier to keep the humidity level in your home above 50%. Breathe in steam for 10-15 minutes, 3-4 times a day, or as told by your doctor. You can do this in the bathroom while a hot shower is running. Try not to spend time in cool or dry air. Rest Rest as much as you can. Sleep with your head raised (elevated). Make sure you get enough sleep each night. General instructions  Put a warm, moist washcloth on your face 3-4 times a day, or as often as told by your doctor. Use nasal saline washes as often as   told by your doctor. Wash your hands often with soap and water. If you cannot use soap and water, use hand sanitizer. Do not smoke. Avoid being around people who are smoking (secondhand smoke). Keep all follow-up visits. Contact a doctor if: You have a fever. Your symptoms get worse. Your symptoms do not get better within 10 days. Get help right away if: You have a very bad headache. You  cannot stop vomiting. You have very bad pain or swelling around your face or eyes. You have trouble seeing. You feel confused. Your neck is stiff. You have trouble breathing. These symptoms may be an emergency. Get help right away. Call 911. Do not wait to see if the symptoms will go away. Do not drive yourself to the hospital. Summary A sinus infection is swelling of your sinuses. Sinuses are hollow spaces in the bones around your face. This condition is caused by tissues in your nose that become inflamed or swollen. This traps germs. These can lead to infection. If you were prescribed an antibiotic medicine, take it as told by your doctor. Do not stop taking it even if you start to feel better. Keep all follow-up visits. This information is not intended to replace advice given to you by your health care provider. Make sure you discuss any questions you have with your health care provider. Document Revised: 04/30/2021 Document Reviewed: 04/30/2021 Elsevier Patient Education  [place] Pain Dental pain is often a sign that something is wrong with your teeth or gums. You can also have pain after a dental treatment. If you have dental pain, it is important to contact your dentist, especially if the cause of the pain is not known. Dental pain may hurt a lot or a little and can be caused by many things, including: Tooth decay (cavities or caries). Infection. The inner part of the tooth being filled with pus (an abscess). Injury. A crack in the tooth. Gums that move back and expose the root of a tooth. Gum disease. Abnormal grinding or clenching of teeth. Not taking good care of your teeth. Sometimes the cause of pain is not known. You may have pain all the time, or it may happen only when you are: Chewing. Exposed to hot or cold temperatures. Eating or drinking foods or drinks that have a lot of sugar in them, such as soda or candy. Follow these instructions at  home: Medicines Take over-the-counter and prescription medicines only as told by your dentist. If you were prescribed an antibiotic medicine, take it as told by your dentist. Do not stop taking it even if you start to feel better. Eating and drinking Do not eat foods or drinks that cause you pain. These include: Very hot or very cold foods or drinks. Sweet or sugary foods or drinks. Managing pain and swelling  If told, put ice on the painful area of your face. To do this: Put ice in a plastic bag. Place a towel between your skin and the bag. Leave the ice on for 20 minutes, 2-3 times a day. Take off the ice if your skin turns bright red. This is very important. If you cannot feel pain, heat, or cold, you have a greater risk of damage to the area. Brushing your teeth Brush your teeth twice a day using a fluoride toothpaste. Use a toothpaste made for sensitive teeth as told by your dentist. Use a soft toothbrush. General instructions Floss your teeth at least once a day. Do not  put heat on the outside of your face. Rinse your mouth often with salt water. To make salt water, dissolve -1 tsp (3-6 g) of salt in 1 cup (237 mL) of warm water. Watch your dental pain. Let your dentist know if there are any changes. Keep all follow-up visits. Contact a dentist if: You have dental pain and you do not know why. Medicine does not help your pain. Your symptoms get worse. You have new symptoms. Get help right away if: You cannot open your mouth. You are having trouble breathing or swallowing. You have a fever. Your face, neck, or jaw is swollen. These symptoms may be an emergency. Get help right away. Call your local emergency services (911 in the [place] Do not wait to see if the symptoms will go away. Do not drive yourself to the hospital. Summary Dental pain may be caused by many things, including tooth decay, injury, or infection. In some cases, the cause is not known. Dental pain may  hurt a lot or very little. You may have pain all the time, or you may have it only when you eat or drink. Take over-the-counter and prescription medicines only as told by your dentist. Watch your dental pain for any changes. Let your dentist know if symptoms get worse. This information is not intended to replace advice given to you by your health care provider. Make sure you discuss any questions you have with your health care provider. Document Revised: 02/29/2020 Document Reviewed: 02/29/2020 Elsevier Patient Education  [place].

## 2022-04-04 LAB — CMP14+EGFR
ALT: 14 IU/L (ref 0–32)
AST: 11 IU/L (ref 0–40)
Albumin/Globulin Ratio: 1.7 (ref 1.2–2.2)
Albumin: 4.4 g/dL (ref 3.8–4.9)
Alkaline Phosphatase: 118 IU/L (ref 44–121)
BUN/Creatinine Ratio: 9 — ABNORMAL LOW (ref 12–28)
BUN: 12 mg/dL (ref 8–27)
Bilirubin Total: 0.3 mg/dL (ref 0.0–1.2)
CO2: 30 mmol/L — ABNORMAL HIGH (ref 20–29)
Calcium: 9.8 mg/dL (ref 8.7–10.3)
Chloride: 100 mmol/L (ref 96–106)
Creatinine, Ser: 1.28 mg/dL — ABNORMAL HIGH (ref 0.57–1.00)
Globulin, Total: 2.6 g/dL (ref 1.5–4.5)
Glucose: 89 mg/dL (ref 70–99)
Potassium: 4.2 mmol/L (ref 3.5–5.2)
Sodium: 142 mmol/L (ref 134–144)
Total Protein: 7 g/dL (ref 6.0–8.5)
eGFR: 48 mL/min/{1.73_m2} — ABNORMAL LOW (ref >59)

## 2022-04-04 LAB — TSH+FREE T4
Free T4: 1.39 ng/dL (ref 0.82–1.77)
TSH: 0.457 u[IU]/mL (ref 0.450–4.500)

## 2022-04-04 LAB — NOVEL CORONAVIRUS, NAA: SARS-CoV-2, NAA: NOT DETECTED

## 2022-04-07 ENCOUNTER — Encounter: Payer: BC Managed Care – PPO | Admitting: Internal Medicine

## 2022-04-30 ENCOUNTER — Encounter: Payer: BC Managed Care – PPO | Admitting: Internal Medicine

## 2022-05-18 ENCOUNTER — Other Ambulatory Visit: Payer: Self-pay | Admitting: Internal Medicine

## 2022-05-20 ENCOUNTER — Other Ambulatory Visit: Payer: Self-pay

## 2022-05-20 ENCOUNTER — Encounter: Payer: Self-pay | Admitting: Internal Medicine

## 2022-05-20 MED ORDER — AMLODIPINE BESYLATE 5 MG PO TABS: 5.0000 mg | ORAL_TABLET | Freq: Every day | ORAL | 1 refills | Status: DC

## 2022-05-21 ENCOUNTER — Telehealth: Payer: BC Managed Care – PPO | Admitting: Physician Assistant

## 2022-05-21 DIAGNOSIS — B9789 Other viral agents as the cause of diseases classified elsewhere: Secondary | ICD-10-CM

## 2022-05-21 DIAGNOSIS — J019 Acute sinusitis, unspecified: Secondary | ICD-10-CM

## 2022-05-21 MED ORDER — BENZONATATE 100 MG PO CAPS: 100.0000 mg | ORAL_CAPSULE | Freq: Three times a day (TID) | ORAL | 0 refills | Status: DC | PRN

## 2022-05-21 MED ORDER — PREDNISONE 20 MG PO TABS: 40.0000 mg | ORAL_TABLET | Freq: Every day | ORAL | 0 refills | Status: DC

## 2022-05-21 NOTE — Progress Notes (Signed)
I have spent 5 minutes in review of e-visit questionnaire, review and updating patient chart, medical decision making and response to patient.   Laporscha Linehan Cody Sanita Estrada, PA-C    

## 2022-05-21 NOTE — Progress Notes (Signed)
E-Visit for Sinus Problems  We are sorry that you are not feeling well.  Here is how we plan to help!  Based on what you have shared with me it looks like you have sinusitis.  Sinusitis is inflammation and infection in the sinus cavities of the head.  Based on your presentation I believe you most likely have Acute Viral Sinusitis.This is an infection most likely caused by a virus. There is not specific treatment for viral sinusitis other than to help you with the symptoms until the infection runs its course.  You may use an oral decongestant such as Mucinex D or if you have glaucoma or high blood pressure use plain Mucinex. Saline nasal spray help and can safely be used as often as needed for congestion, I have prescribed: a prescription cough medication and s short course of steroid to reduce sinus inflammation.   Some authorities believe that zinc sprays or the use of Echinacea may shorten the course of your symptoms.  Sinus infections are not as easily transmitted as other respiratory infection, however we still recommend that you avoid close contact with loved ones, especially the very young and elderly.  Remember to wash your hands thoroughly throughout the day as this is the number one way to prevent the spread of infection!  Home Care: Only take medications as instructed by your medical team. Do not take these medications with alcohol. A steam or ultrasonic humidifier can help congestion.  You can place a towel over your head and breathe in the steam from hot water coming from a faucet. Avoid close contacts especially the very young and the elderly. Cover your mouth when you cough or sneeze. Always remember to wash your hands.  Get Help Right Away If: You develop worsening fever or sinus pain. You develop a severe head ache or visual changes. Your symptoms persist after you have completed your treatment plan.  Make sure you Understand these instructions. Will watch your  condition. Will get help right away if you are not doing well or get worse.   Thank you for choosing an e-visit.  Your e-visit answers were reviewed by a board certified advanced clinical practitioner to complete your personal care plan. Depending upon the condition, your plan could have included both over the counter or prescription medications.  Please review your pharmacy choice. Make sure the pharmacy is open so you can pick up prescription now. If there is a problem, you may contact your provider through [employer] and have the prescription routed to another pharmacy.  Your safety is important to [place]. If you have drug allergies check your prescription carefully.   For the next 24 hours you can use MyChart to ask questions about today's visit, request a non-urgent call back, or ask for a work or school excuse. You will get an email in the next two days asking about your experience. I hope that your e-visit has been valuable and will speed your recovery.

## 2022-06-19 ENCOUNTER — Encounter: Payer: Self-pay | Admitting: Internal Medicine

## 2022-06-19 ENCOUNTER — Ambulatory Visit (INDEPENDENT_AMBULATORY_CARE_PROVIDER_SITE_OTHER): Payer: BC Managed Care – PPO | Admitting: Internal Medicine

## 2022-06-19 VITALS — BP 118/70 | HR 73 | Temp 98.6°F | Ht 65.4 in | Wt 161.4 lb

## 2022-06-19 DIAGNOSIS — R7309 Other abnormal glucose: Secondary | ICD-10-CM | POA: Diagnosis not present

## 2022-06-19 DIAGNOSIS — E039 Hypothyroidism, unspecified: Secondary | ICD-10-CM | POA: Diagnosis not present

## 2022-06-19 DIAGNOSIS — R0789 Other chest pain: Secondary | ICD-10-CM | POA: Diagnosis not present

## 2022-06-19 DIAGNOSIS — Z23 Encounter for immunization: Secondary | ICD-10-CM | POA: Diagnosis not present

## 2022-06-19 DIAGNOSIS — I10 Essential (primary) hypertension: Secondary | ICD-10-CM | POA: Diagnosis not present

## 2022-06-19 DIAGNOSIS — Z0000 Encounter for general adult medical examination without abnormal findings: Secondary | ICD-10-CM

## 2022-06-19 DIAGNOSIS — Z2821 Immunization not carried out because of patient refusal: Secondary | ICD-10-CM

## 2022-06-19 DIAGNOSIS — E559 Vitamin D deficiency, unspecified: Secondary | ICD-10-CM

## 2022-06-19 DIAGNOSIS — Z6826 Body mass index (BMI) 26.0-26.9, adult: Secondary | ICD-10-CM

## 2022-06-19 LAB — POCT URINALYSIS DIPSTICK
Bilirubin, UA: NEGATIVE
Blood, UA: NEGATIVE
Glucose, UA: NEGATIVE
Ketones, UA: NEGATIVE
Leukocytes, UA: NEGATIVE
Nitrite, UA: NEGATIVE
Protein, UA: NEGATIVE
Spec Grav, UA: 1.02 (ref 1.010–1.025)
Urobilinogen, UA: 1 E.U./dL
pH, UA: 7 (ref 5.0–8.0)

## 2022-06-19 NOTE — Patient Instructions (Signed)

## 2022-06-19 NOTE — Progress Notes (Signed)
[name],acting as a Education administrator for Maximino Greenland, MD.,have documented all relevant documentation on the behalf of Maximino Greenland, MD,as directed by  Maximino Greenland, MD while in the presence of Maximino Greenland, MD.   Subjective:     Patient ID: Kaitlyn Terry , female    DOB: 03-13-1961 , 62 y.o.   MRN: [id number]   Chief Complaint  Patient presents with   Annual Exam   Hypertension    HPI  She is here today for a full physical examination. She is followed by GYN for her pelvic exams. She reports compliance with meds. She denies having any headaches, palpitations and shortness of breath.   Hypertension This is a chronic problem. The current episode started more than 1 year ago. The problem has been gradually improving since onset. The problem is controlled. Pertinent negatives include no blurred vision. Risk factors for coronary artery disease include obesity and sedentary lifestyle. The current treatment provides moderate improvement. Compliance problems include exercise.      Past Medical History:  Diagnosis Date   Breast density    Pelvic pain    Yeast vaginitis      Family History  Problem Relation Age of Onset   COPD Mother    Asthma Mother    Cancer Father    Allergies Father      Current Outpatient Medications:    amLODipine (NORVASC) 5 MG tablet, Take 1 tablet (5 mg total) by mouth daily., Disp: 90 tablet, Rfl: 1   fluticasone (FLONASE) 50 MCG/ACT nasal spray, Place 1 spray into both nostrils daily., Disp: 15.8 g, Rfl: 1   SYNTHROID 88 MCG tablet, TAKE 1 TABLET BY MOUTH MON - FRI AND 1/2 TAB ON SAT, Disp: 78 tablet, Rfl: 2   Vitamin D, Ergocalciferol, (DRISDOL) 1.25 MG (50000 UNIT) CAPS capsule, TAKE 1 CAPSULE BY ORAL ROUTE once weekly, Disp: 12 capsule, Rfl: 1   Allergies  Allergen Reactions   Peanuts [Peanut Oil]       The patient states she uses post menopausal status for birth control. Last LMP was No LMP recorded. Patient has had a  hysterectomy.. Negative for Dysmenorrhea. Negative for: breast discharge, breast lump(s), breast pain and breast self exam. Associated symptoms include abnormal vaginal bleeding. Pertinent negatives include abnormal bleeding (hematology), anxiety, decreased libido, depression, difficulty falling sleep, dyspareunia, history of infertility, nocturia, sexual dysfunction, sleep disturbances, urinary incontinence, urinary urgency, vaginal discharge and vaginal itching. Diet regular.The patient states her exercise level is  intermittent.  . The patient's tobacco use is:  Social History   Tobacco Use  Smoking Status Never  Smokeless Tobacco Never  . She has been exposed to passive smoke. The patient's alcohol use is:  Social History   Substance and Sexual Activity  Alcohol Use No   Review of Systems  Constitutional: Negative.   HENT: Negative.    Eyes: Negative.  Negative for blurred vision.  Respiratory: Negative.    Cardiovascular: Negative.        She c/o intermittent chest heaviness. Unable to determine triggers. Denies sob. Initially thought it was related to food intake. Denies associated diaphoresis. Sx occur at rest, not with exertion. She is able to exercise on her treadmill without difficulty.    Gastrointestinal: Negative.   Endocrine: Negative.   Genitourinary: Negative.   Musculoskeletal: Negative.   Skin: Negative.   Allergic/Immunologic: Negative.   Neurological: Negative.   Hematological: Negative.   Psychiatric/Behavioral: Negative.      Today's  Vitals   06/19/22 1421  BP: 118/70  Pulse: 73  Temp: 98.6 F (37 C)  Weight: 161 lb 6.4 oz (73.2 kg)  Height: 5' 5.4" (1.661 m)  PainSc: 0-No pain   Body mass index is 26.53 kg/m.  Wt Readings from Last 3 Encounters:  06/19/22 161 lb 6.4 oz (73.2 kg)  04/03/22 159 lb (72.1 kg)  12/05/21 157 lb 9.6 oz (71.5 kg)     Objective:  Physical Exam Vitals and nursing note reviewed.  Constitutional:      Appearance:  Normal appearance.  HENT:     Head: Normocephalic and atraumatic.     Right Ear: Tympanic membrane, ear canal and external ear normal.     Left Ear: Tympanic membrane, ear canal and external ear normal.     Nose:     Comments: Masked     Mouth/Throat:     Comments: Masked  Eyes:     Extraocular Movements: Extraocular movements intact.     Conjunctiva/sclera: Conjunctivae normal.     Pupils: Pupils are equal, round, and reactive to light.  Cardiovascular:     Rate and Rhythm: Normal rate and regular rhythm.     Pulses: Normal pulses.     Heart sounds: Normal heart sounds.  Pulmonary:     Effort: Pulmonary effort is normal.     Breath sounds: Normal breath sounds.  Chest:  Breasts:    Tanner Score is 5.     Right: Normal.     Left: Normal.  Abdominal:     General: Bowel sounds are normal.     Palpations: Abdomen is soft.  Genitourinary:    Comments: deferred Musculoskeletal:        General: Normal range of motion.     Cervical back: Normal range of motion and neck supple.  Skin:    General: Skin is warm and dry.  Neurological:     General: No focal deficit present.     Mental Status: She is alert and oriented to person, place, and time.  Psychiatric:        Mood and Affect: Mood normal.        Behavior: Behavior normal.     Assessment And Plan:     1. Encounter for general adult medical examination w/o abnormal findings Comments: A full exam was performed. Importance of monthly self breast exams was discussed with the patient. PATIENT IS ADVISED TO GET 30-45 MINUTES REGULAR EXERCISE NO LESS THAN FOUR TO FIVE DAYS PER WEEK - BOTH WEIGHTBEARING EXERCISES AND AEROBIC ARE RECOMMENDED.  PATIENT IS ADVISED TO FOLLOW A HEALTHY DIET WITH AT LEAST SIX FRUITS/VEGGIES PER DAY, DECREASE INTAKE OF RED MEAT, AND TO INCREASE FISH INTAKE TO TWO DAYS PER WEEK.  MEATS/FISH SHOULD NOT BE FRIED, BAKED OR BROILED IS PREFERABLE.  IT IS ALSO IMPORTANT TO CUT BACK ON YOUR SUGAR INTAKE. PLEASE  AVOID ANYTHING WITH ADDED SUGAR, CORN SYRUP OR OTHER SWEETENERS. IF YOU MUST USE A SWEETENER, YOU CAN TRY STEVIA. IT IS ALSO IMPORTANT TO AVOID ARTIFICIALLY SWEETENERS AND DIET BEVERAGES. LASTLY, I SUGGEST WEARING SPF 50 SUNSCREEN ON EXPOSED PARTS AND ESPECIALLY WHEN IN THE DIRECT SUNLIGHT FOR AN EXTENDED PERIOD OF TIME.  PLEASE AVOID FAST FOOD RESTAURANTS AND INCREASE YOUR WATER INTAKE. - CBC - CMP14+EGFR - Lipid panel - Hemoglobin A1c  2. Essential hypertension, benign Comments: Chronic, well controlled.  She will c/w amlodipine 5mg  daily. EKG performed, NSR w/o acute changes. She will f/u in six months. - POCT Urinalysis  Dipstick (81002) - Microalbumin / Creatinine Urine Ratio - EKG 12-Lead  3. Chest heaviness Comments: EKG performed, NSR w/o acute changes. Sx suggestive of GI issues, denies fam hx CAD. Advised to stop eating 3 hrs prior to lying down, she will let me know if her sx persist/worsen.   4. Primary hypothyroidism Comments: Chronic, I will check thyroid panel and adjust meds as needed. She will f/u in six months for re-evaluation.  5. Other abnormal glucose Comments: Her a1c has been elevated in the past. I will recheck this today. Advised to limit her intake of sugary beverages/foods.  6. Vitamin D deficiency disease Comments: I will check a vitamin D level and supplement as needed. - Vitamin D (25 hydroxy)  7. BMI 26.0-26.9,adult Comments: She is encouraged to aim for at least 150 minutes of exercise per week.  8. Immunization due - Tdap vaccine greater than or equal to 7yo IM   Patient was given opportunity to ask questions. Patient verbalized understanding of the plan and was able to repeat key elements of the plan. All questions were answered to their satisfaction.   I, Maximino Greenland, MD, have reviewed all documentation for this visit. The documentation on 06/19/22 for the exam, diagnosis, procedures, and orders are all accurate and complete.   THE PATIENT IS  ENCOURAGED TO PRACTICE SOCIAL DISTANCING DUE TO THE COVID-19 PANDEMIC.

## 2022-06-20 LAB — LIPID PANEL
Chol/HDL Ratio: 4.8 ratio — ABNORMAL HIGH (ref 0.0–4.4)
Cholesterol, Total: 243 mg/dL — ABNORMAL HIGH (ref 100–199)
HDL: 51 mg/dL (ref >39)
LDL Chol Calc (NIH): 170 mg/dL — ABNORMAL HIGH (ref 0–99)
Triglycerides: 125 mg/dL (ref 0–149)
VLDL Cholesterol Cal: 22 mg/dL (ref 5–40)

## 2022-06-20 LAB — CMP14+EGFR
ALT: 14 IU/L (ref 0–32)
AST: 15 IU/L (ref 0–40)
Albumin/Globulin Ratio: 1.5 (ref 1.2–2.2)
Albumin: 4.4 g/dL (ref 3.8–4.9)
Alkaline Phosphatase: 126 IU/L — ABNORMAL HIGH (ref 44–121)
BUN/Creatinine Ratio: 10 — ABNORMAL LOW (ref 12–28)
BUN: 10 mg/dL (ref 8–27)
Bilirubin Total: 0.2 mg/dL (ref 0.0–1.2)
CO2: 28 mmol/L (ref 20–29)
Calcium: 10 mg/dL (ref 8.7–10.3)
Chloride: 99 mmol/L (ref 96–106)
Creatinine, Ser: 0.96 mg/dL (ref 0.57–1.00)
Globulin, Total: 2.9 g/dL (ref 1.5–4.5)
Glucose: 80 mg/dL (ref 70–99)
Potassium: 4.1 mmol/L (ref 3.5–5.2)
Sodium: 142 mmol/L (ref 134–144)
Total Protein: 7.3 g/dL (ref 6.0–8.5)
eGFR: 68 mL/min/{1.73_m2} (ref >59)

## 2022-06-20 LAB — MICROALBUMIN / CREATININE URINE RATIO
Creatinine, Urine: 128.9 mg/dL
Microalb/Creat Ratio: 3 mg/g creat (ref 0–29)
Microalbumin, Urine: 4.5 ug/mL

## 2022-06-20 LAB — CBC
Hematocrit: 40.2 % (ref 34.0–46.6)
Hemoglobin: 13.6 g/dL (ref 11.1–15.9)
MCH: 29.4 pg (ref 26.6–33.0)
MCHC: 33.8 g/dL (ref 31.5–35.7)
MCV: 87 fL (ref 79–97)
Platelets: 321 10*3/uL (ref 150–450)
RBC: 4.63 x10E6/uL (ref 3.77–5.28)
RDW: 13.2 % (ref 11.7–15.4)
WBC: 8.1 10*3/uL (ref 3.4–10.8)

## 2022-06-20 LAB — HEMOGLOBIN A1C
Est. average glucose Bld gHb Est-mCnc: 123 mg/dL
Hgb A1c MFr Bld: 5.9 % — ABNORMAL HIGH (ref 4.8–5.6)

## 2022-06-20 LAB — VITAMIN D 25 HYDROXY (VIT D DEFICIENCY, FRACTURES): Vit D, 25-Hydroxy: 12.4 ng/mL — ABNORMAL LOW (ref 30.0–100.0)

## 2022-06-25 ENCOUNTER — Other Ambulatory Visit: Payer: Self-pay | Admitting: Internal Medicine

## 2022-06-25 DIAGNOSIS — E7800 Pure hypercholesterolemia, unspecified: Secondary | ICD-10-CM

## 2022-06-25 NOTE — Progress Notes (Signed)
The 10-year ASCVD risk score (Arnett DK, et al., 2019) is: 6.7%   Values used to calculate the score:     Age: 62 years     Sex: Female     Is Non-Hispanic African American: Yes     Diabetic: No     Tobacco smoker: No     Systolic Blood Pressure: 301 mmHg     Is BP treated: Yes     HDL Cholesterol: 51 mg/dL     Total Cholesterol: 243 mg/dL

## 2022-06-29 DIAGNOSIS — E663 Overweight: Secondary | ICD-10-CM | POA: Insufficient documentation

## 2022-06-29 DIAGNOSIS — R7309 Other abnormal glucose: Secondary | ICD-10-CM | POA: Insufficient documentation

## 2022-06-29 DIAGNOSIS — Z6828 Body mass index (BMI) 28.0-28.9, adult: Secondary | ICD-10-CM | POA: Insufficient documentation

## 2022-06-29 DIAGNOSIS — Z6826 Body mass index (BMI) 26.0-26.9, adult: Secondary | ICD-10-CM | POA: Insufficient documentation

## 2022-06-29 DIAGNOSIS — E559 Vitamin D deficiency, unspecified: Secondary | ICD-10-CM | POA: Insufficient documentation

## 2022-07-07 ENCOUNTER — Ambulatory Visit (HOSPITAL_COMMUNITY)
Admission: RE | Admit: 2022-07-07 | Discharge: 2022-07-07 | Disposition: A | Discharge status: Home / Self Care | Payer: BC Managed Care – PPO | Source: Ambulatory Visit | Attending: Internal Medicine | Admitting: Internal Medicine

## 2022-07-07 DIAGNOSIS — E7800 Pure hypercholesterolemia, unspecified: Secondary | ICD-10-CM | POA: Insufficient documentation

## 2022-09-15 ENCOUNTER — Ambulatory Visit: Payer: BC Managed Care – PPO | Admitting: Internal Medicine

## 2022-09-15 VITALS — BP 124/80 | HR 87 | Temp 98.3°F | Ht 64.0 in | Wt 164.0 lb

## 2022-09-15 DIAGNOSIS — J208 Acute bronchitis due to other specified organisms: Secondary | ICD-10-CM | POA: Diagnosis not present

## 2022-09-15 DIAGNOSIS — R051 Acute cough: Secondary | ICD-10-CM

## 2022-09-15 DIAGNOSIS — R0602 Shortness of breath: Secondary | ICD-10-CM

## 2022-09-15 DIAGNOSIS — J029 Acute pharyngitis, unspecified: Secondary | ICD-10-CM

## 2022-09-15 MED ORDER — TRIAMCINOLONE ACETONIDE 40 MG/ML IJ SUSP
40.0000 mg | Freq: Once | INTRAMUSCULAR | Status: AC
  Administered 2022-09-15: 40 mg via INTRAMUSCULAR

## 2022-09-15 MED ORDER — AIRSUPRA 90-80 MCG/ACT IN AERO: 90.0000 ug | INHALATION_SPRAY | Freq: Two times a day (BID) | RESPIRATORY_TRACT | 1 refills | Status: AC | PRN

## 2022-09-15 NOTE — Progress Notes (Signed)
Hershal CoriaI,Ciera J Martin,acting as a Neurosurgeonscribe for Gwynneth Alimentobyn N Haeley Fordham, MD.,have documented all relevant documentation on the behalf of Gwynneth Alimentobyn N Alonni Heimsoth, MD,as directed by  Gwynneth Alimentobyn N Marilin Kofman, MD while in the presence of Gwynneth Alimentobyn N Aamani Moose, MD.    Subjective:     Patient ID: Kaitlyn ReedyAnitra D Russom , female    DOB: 08/09/1960 , 62 y.o.   MRN: [id number]   Chief Complaint  Patient presents with   URI    HPI  Patient presents today for evaluation of seasonal allergies.  She went to Urgent care on 09/02/22 for evaluation of a sore throat and bad cough. She states now her throat isn't as sore but she is having SOB, she states she has little pain from her throat to her ear but it is mild. She states OTC allergy medications don't seem to work.  BP Readings from Last 3 Encounters: 09/15/22 : 124/80 06/19/22 : 118/70 04/03/22 : 124/72       Past Medical History:  Diagnosis Date   Breast density    Pelvic pain    Yeast vaginitis      Family History  Problem Relation Age of Onset   COPD Mother    Asthma Mother    Cancer Father    Allergies Father      Current Outpatient Medications:    Albuterol-Budesonide (AIRSUPRA) 90-80 MCG/ACT AERO, Inhale 90 mcg into the lungs 2 (two) times daily as needed., Disp: 10 g, Rfl: 1   amLODipine (NORVASC) 5 MG tablet, Take 1 tablet (5 mg total) by mouth daily., Disp: 90 tablet, Rfl: 1   fluticasone (FLONASE) 50 MCG/ACT nasal spray, Place 1 spray into both nostrils daily., Disp: 15.8 g, Rfl: 1   SYNTHROID 88 MCG tablet, TAKE 1 TABLET BY MOUTH MON - FRI AND 1/2 TAB ON SAT, Disp: 78 tablet, Rfl: 2   Vitamin D, Ergocalciferol, (DRISDOL) 1.25 MG (50000 UNIT) CAPS capsule, TAKE 1 CAPSULE BY ORAL ROUTE once weekly, Disp: 12 capsule, Rfl: 1   Allergies  Allergen Reactions   Peanuts [Peanut Oil]      Review of Systems  Constitutional: Negative.   HENT:  Positive for congestion.   Respiratory:  Positive for cough.   Cardiovascular: Negative.   Gastrointestinal: Negative.    Neurological: Negative.   Psychiatric/Behavioral: Negative.       Today's Vitals   09/15/22 1541  BP: 124/80  Pulse: 87  Temp: 98.3 F (36.8 C)  TempSrc: Oral  SpO2: 96%  Weight: 164 lb (74.4 kg)  Height: 5\' 4"  (1.626 m)  PainSc: 0-No pain   Body mass index is 28.15 kg/m.  Wt Readings from Last 3 Encounters:  09/15/22 164 lb (74.4 kg)  06/19/22 161 lb 6.4 oz (73.2 kg)  04/03/22 159 lb (72.1 kg)    Objective:  Physical Exam Vitals and nursing note reviewed.  Constitutional:      Appearance: Normal appearance.  HENT:     Head: Normocephalic and atraumatic.     Right Ear: Tympanic membrane, ear canal and external ear normal. There is no impacted cerumen.     Left Ear: Tympanic membrane, ear canal and external ear normal. There is no impacted cerumen.     Nose:     Comments: Masked     Mouth/Throat:     Comments: Masked  Eyes:     Extraocular Movements: Extraocular movements intact.  Cardiovascular:     Rate and Rhythm: Normal rate and regular rhythm.     Heart sounds: Normal  heart sounds.  Pulmonary:     Effort: Pulmonary effort is normal.     Comments: Decreased breath sounds, no wheezing Musculoskeletal:     Cervical back: Normal range of motion.  Skin:    General: Skin is warm.  Neurological:     General: No focal deficit present.     Mental Status: She is alert.  Psychiatric:        Mood and Affect: Mood normal.        Behavior: Behavior normal.         Assessment And Plan:     1. Acute viral bronchitis Comments: UC notes reviewed. She was given Kenalog, 40mg  IM x1, will send rx Airsupra inhaler to the pharmacy.  Reminded to avoid dairy during acute symptoms. - Albuterol-Budesonide (AIRSUPRA) 90-80 MCG/ACT AERO; Inhale 90 mcg into the lungs 2 (two) times daily as needed.  Dispense: 10 g; Refill: 1 - triamcinolone acetonide (KENALOG-40) injection 40 mg     Patient was given opportunity to ask questions. Patient verbalized understanding of the plan  and was able to repeat key elements of the plan. All questions were answered to their satisfaction.   I, Gwynneth Alimentobyn N Enda Santo, MD, have reviewed all documentation for this visit. The documentation on 09/15/22 for the exam, diagnosis, procedures, and orders are all accurate and complete.   IF YOU HAVE BEEN REFERRED TO A SPECIALIST, IT MAY TAKE 1-2 WEEKS TO SCHEDULE/PROCESS THE REFERRAL. IF YOU HAVE NOT HEARD FROM US/SPECIALIST IN TWO WEEKS, PLEASE GIVE US A CALL AT [phone].   THE PATIENT IS ENCOURAGED TO PRACTICE SOCIAL DISTANCING DUE TO THE COVID-19 PANDEMIC.

## 2022-09-21 ENCOUNTER — Encounter: Payer: Self-pay | Admitting: Internal Medicine

## 2022-09-23 ENCOUNTER — Other Ambulatory Visit: Payer: Self-pay | Admitting: Internal Medicine

## 2022-09-29 ENCOUNTER — Emergency Department (HOSPITAL_BASED_OUTPATIENT_CLINIC_OR_DEPARTMENT_OTHER): Payer: BC Managed Care – PPO

## 2022-09-29 ENCOUNTER — Encounter (HOSPITAL_BASED_OUTPATIENT_CLINIC_OR_DEPARTMENT_OTHER): Payer: Self-pay | Admitting: Emergency Medicine

## 2022-09-29 ENCOUNTER — Emergency Department (HOSPITAL_BASED_OUTPATIENT_CLINIC_OR_DEPARTMENT_OTHER)
Admission: EM | Admit: 2022-09-29 | Discharge: 2022-09-29 | Disposition: A | Discharge status: Home / Self Care | Payer: BC Managed Care – PPO | Attending: Emergency Medicine | Admitting: Emergency Medicine

## 2022-09-29 ENCOUNTER — Other Ambulatory Visit (HOSPITAL_BASED_OUTPATIENT_CLINIC_OR_DEPARTMENT_OTHER): Payer: Self-pay

## 2022-09-29 ENCOUNTER — Other Ambulatory Visit: Payer: Self-pay

## 2022-09-29 DIAGNOSIS — R072 Precordial pain: Secondary | ICD-10-CM | POA: Insufficient documentation

## 2022-09-29 DIAGNOSIS — Z79899 Other long term (current) drug therapy: Secondary | ICD-10-CM | POA: Diagnosis not present

## 2022-09-29 DIAGNOSIS — I10 Essential (primary) hypertension: Secondary | ICD-10-CM | POA: Diagnosis not present

## 2022-09-29 DIAGNOSIS — R079 Chest pain, unspecified: Secondary | ICD-10-CM | POA: Diagnosis present

## 2022-09-29 LAB — COMPREHENSIVE METABOLIC PANEL
ALT: 14 U/L (ref 0–44)
AST: 17 U/L (ref 15–41)
Albumin: 4.3 g/dL (ref 3.5–5.0)
Alkaline Phosphatase: 89 U/L (ref 38–126)
Anion gap: 10 (ref 5–15)
BUN: 12 mg/dL (ref 6–20)
CO2: 29 mmol/L (ref 22–32)
Calcium: 10 mg/dL (ref 8.9–10.3)
Chloride: 102 mmol/L (ref 98–111)
Creatinine, Ser: 0.85 mg/dL (ref 0.44–1.00)
GFR, Estimated: >60 mL/min (ref >60)
Glucose, Bld: 82 mg/dL (ref 70–99)
Potassium: 3.6 mmol/L (ref 3.5–5.1)
Sodium: 141 mmol/L (ref 135–145)
Total Bilirubin: 0.4 mg/dL (ref 0.3–1.2)
Total Protein: 7.9 g/dL (ref 6.5–8.1)

## 2022-09-29 LAB — CBC WITH DIFFERENTIAL/PLATELET
Abs Immature Granulocytes: 0.02 10*3/uL (ref 0.00–0.07)
Basophils Absolute: 0.1 10*3/uL (ref 0.0–0.1)
Basophils Relative: 1 %
Eosinophils Absolute: 0.1 10*3/uL (ref 0.0–0.5)
Eosinophils Relative: 1 %
HCT: 45.6 % (ref 36.0–46.0)
Hemoglobin: 14.6 g/dL (ref 12.0–15.0)
Immature Granulocytes: 0 %
Lymphocytes Relative: 28 %
Lymphs Abs: 2.2 10*3/uL (ref 0.7–4.0)
MCH: 28.9 pg (ref 26.0–34.0)
MCHC: 32 g/dL (ref 30.0–36.0)
MCV: 90.3 fL (ref 80.0–100.0)
Monocytes Absolute: 0.5 10*3/uL (ref 0.1–1.0)
Monocytes Relative: 6 %
Neutro Abs: 5 10*3/uL (ref 1.7–7.7)
Neutrophils Relative %: 64 %
Platelets: 326 10*3/uL (ref 150–400)
RBC: 5.05 MIL/uL (ref 3.87–5.11)
RDW: 13.2 % (ref 11.5–15.5)
WBC: 7.7 10*3/uL (ref 4.0–10.5)
nRBC: 0 % (ref 0.0–0.2)

## 2022-09-29 LAB — LIPASE, BLOOD: Lipase: <10 U/L — ABNORMAL LOW (ref 11–51)

## 2022-09-29 LAB — TROPONIN I (HIGH SENSITIVITY)
Troponin I (High Sensitivity): 2 ng/L (ref <18)
Troponin I (High Sensitivity): <2 ng/L (ref <18)

## 2022-09-29 NOTE — ED Notes (Signed)
Patient verbalizes understanding of discharge instructions. Opportunity for questioning and answers were provided. Patient discharged from ED.  °

## 2022-09-29 NOTE — ED Triage Notes (Signed)
Pt arrives to ED with c/o chest pain that started today. She notes current radiation to jaw and left arm. She also notes last week she notes tingling to her left arm.

## 2022-09-29 NOTE — Discharge Instructions (Signed)
Your workup today is reassuring.  No concerning cause of your chest pain.  I have given you referral to cardiologist.  Follow-up with your primary care doctor as well.  The cardiology office should reach out to you in the next day or 2 to schedule this appointment if you do not hear from them please give their office a call to schedule this appointment.  For any concerning symptoms, worsening chest pain please return to the emergency department.

## 2022-09-29 NOTE — ED Provider Notes (Signed)
[place] EMERGENCY DEPARTMENT AT [facility] Provider Note   CSN: [id number] Arrival date & time: 09/29/22  1043     History  Chief Complaint  Patient presents with   Chest Pain    Kaitlyn Terry is a 62 y.o. female.  62 year old female presents today for evaluation of chest pain.  This initially started around Friday and was left-sided and felt like a spasm.  However today the pain got worse and then radiated down left arm, up to the left jaw.  Denies prior history of cardiac disease with the exception of hypertension.  No significant family history of cardiac disease.  Denies associated diaphoresis, shortness of breath, lightheadedness, or palpitations.  Denies prior history of DVT or PE.  No recent long travel, surgery.  The history is provided by the patient. No language interpreter was used.       Home Medications Prior to Admission medications   Medication Sig Start Date End Date Taking? Authorizing Provider  Albuterol-Budesonide (AIRSUPRA) 90-80 MCG/ACT AERO Inhale 90 mcg into the lungs 2 (two) times daily as needed. 09/15/22   Dorothyann Peng, MD  amLODipine (NORVASC) 5 MG tablet TAKE 1 TABLET (5 MG TOTAL) BY MOUTH DAILY. 09/24/22   Dorothyann Peng, MD  fluticasone [facility]) 50 MCG/ACT nasal spray Place 1 spray into both nostrils daily. 05/14/18 09/15/22  Dorothyann Peng, MD  SYNTHROID 88 MCG tablet TAKE 1 TABLET BY MOUTH MON - FRI AND 1/2 TAB ON SAT 09/24/22   Dorothyann Peng, MD  Vitamin D, Ergocalciferol, (DRISDOL) 1.25 MG (50000 UNIT) CAPS capsule TAKE 1 CAPSULE BY ORAL ROUTE once weekly 10/02/21   Dorothyann Peng, MD      Allergies    Peanuts [peanut oil]    Review of Systems   Review of Systems  Constitutional:  Negative for fever.  Respiratory:  Negative for shortness of breath.   Cardiovascular:  Positive for chest pain. Negative for palpitations and leg swelling.  Gastrointestinal:  Negative for abdominal pain.  Neurological:  Negative for syncope and  light-headedness.  All other systems reviewed and are negative.   Physical Exam Updated Vital Signs BP 129/74   Pulse 65   Temp 98.7 F (37.1 C) (Oral)   Resp 12   Ht  (1.626 m)   Wt 72.1 kg   SpO2 100%   BMI 27.29 kg/m  Physical Exam Vitals and nursing note reviewed.  Constitutional:      General: She is not in acute distress.    Appearance: Normal appearance. She is not ill-appearing.  HENT:     Head: Normocephalic and atraumatic.     Nose: Nose normal.  Eyes:     General: No scleral icterus.    Extraocular Movements: Extraocular movements intact.     Conjunctiva/sclera: Conjunctivae normal.  Cardiovascular:     Rate and Rhythm: Normal rate and regular rhythm.     Pulses: Normal pulses.  Pulmonary:     Effort: Pulmonary effort is normal. No respiratory distress.     Breath sounds: Normal breath sounds. No wheezing or rales.  Abdominal:     General: There is no distension.     Tenderness: There is no abdominal tenderness.  Musculoskeletal:        General: Normal range of motion.     Cervical back: Normal range of motion.  Skin:    General: Skin is warm and dry.  Neurological:     General: No focal deficit present.     Mental Status: She  is alert. Mental status is at baseline.     ED Results / Procedures / Treatments   Labs (all labs ordered are listed, but only abnormal results are displayed) Labs Reviewed  LIPASE, BLOOD - Abnormal; Notable for the following components:      Result Value   Lipase <10 (*)    All other components within normal limits  CBC WITH DIFFERENTIAL/PLATELET  COMPREHENSIVE METABOLIC PANEL  TROPONIN I (HIGH SENSITIVITY)  TROPONIN I (HIGH SENSITIVITY)    EKG EKG Interpretation  Date/Time:  Monday September 29 2022 10:56:42 EDT Ventricular Rate:  82 PR Interval:  154 QRS Duration: 78 QT Interval:  378 QTC Calculation: 442 R Axis:   46 Text Interpretation: Sinus rhythm Confirmed by Virgina Norfolk ([id number]) on 09/29/2022 11:24:49  AM  Radiology DG Chest Portable 1 View  Result Date: 09/29/2022 CLINICAL DATA:  Chest pain. EXAM: PORTABLE CHEST 1 VIEW COMPARISON:  Chest x-ray 01/24/2010. FINDINGS: The heart size and mediastinal contours are within normal limits. Both lungs are clear. No visible pleural effusions or pneumothorax. No acute osseous abnormality. IMPRESSION: No active disease. Electronically Signed   By: Feliberto Harts M.D.   On: 09/29/2022 11:24    Procedures Procedures    Medications Ordered in ED Medications - No data to display  ED Course/ Medical Decision Making/ A&P                             Medical Decision Making Amount and/or Complexity of Data Reviewed Labs: ordered. Radiology: ordered.   Medical Decision Making / ED Course   This patient presents to the ED for concern of chest pain, this involves an extensive number of treatment options, and is a complaint that carries with it a high risk of complications and morbidity.  The differential diagnosis includes ACS, PE, pneumonia, MSK etiology, GERD  MDM: 62 year old female presents today for evaluation of chest pain.  It is left-sided and radiates to the arm at the jaw.  She also started exercising about a week ago with walking on a treadmill, and Pilates.  Pain is not reproducible on exam.  Heart score of 2.  Low risk for PE given she is without tachypnea, tachycardia, and without hypoxia.  Also without recent long travel, not hypercoagulable.  CBC is unremarkable, CMP is unremarkable.  Lipase within normal limits.  Troponin negative x 2.  EKG without acute ischemic changes.  Chest x-ray without acute cardiopulmonary process.  Low suspicion for ACS or other acute etiology of the chest pain.  Cardiology referral provided.  Discussed follow-up with PCP.  Patient is appropriate for discharge.  Discharged in stable condition.  Strict return precautions given.  Patient and husband voiced understanding and are in agreement with plan.   Lab  Tests: -I ordered, reviewed, and interpreted labs.   The pertinent results include:   Labs Reviewed  LIPASE, BLOOD - Abnormal; Notable for the following components:      Result Value   Lipase <10 (*)    All other components within normal limits  CBC WITH DIFFERENTIAL/PLATELET  COMPREHENSIVE METABOLIC PANEL  TROPONIN I (HIGH SENSITIVITY)  TROPONIN I (HIGH SENSITIVITY)      EKG  EKG Interpretation  Date/Time:  Monday September 29 2022 10:56:42 EDT Ventricular Rate:  82 PR Interval:  154 QRS Duration: 78 QT Interval:  378 QTC Calculation: 442 R Axis:   46 Text Interpretation: Sinus rhythm Confirmed by Lockie Mola, Adam ([id number]) on 09/29/2022 11:24:49  AM         Imaging Studies ordered: I ordered imaging studies including chest x-ray I independently visualized and interpreted imaging. I agree with the radiologist interpretation   Medicines ordered and prescription drug management: No orders of the defined types were placed in this encounter.   -I have reviewed the patients home medicines and have made adjustments as needed   Reevaluation: After the interventions noted above, I reevaluated the patient and found that they have :resolved  Co morbidities that complicate the patient evaluation  Past Medical History:  Diagnosis Date   Breast density    Pelvic pain    Yeast vaginitis       Dispostion: Patient is appropriate for discharge.  Discharged in stable condition.  Return precautions discussed.  Final Clinical Impression(s) / ED Diagnoses Final diagnoses:  Precordial chest pain    Rx / DC Orders ED Discharge Orders          Ordered    Ambulatory referral to Cardiology       Comments: If you have not heard from the Cardiology office within the next 72 hours please call [phone].   09/29/22 1418              Marita Kansas, PA-C 09/29/22 1424    Virgina Norfolk, DO 09/29/22 1508

## 2022-10-29 NOTE — Progress Notes (Signed)
Cardiology Office Note:   Date:  10/31/2022  NAME:  Kaitlyn Terry    MRN: [id number] DOB:  12/17/1960   PCP:  Dorothyann Peng, MD  Cardiologist:  None  Electrophysiologist:  None   Referring MD: Marita Kansas, PA-C   Chief Complaint  Patient presents with   Chest Pain    History of Present Illness:   Kaitlyn Terry is a 62 y.o. female with a hx of hypertension who is being seen today for the evaluation of chest pain at the request of Dorothyann Peng, MD. seen in the ER on 09/29/2022 for chest pain.  Troponins negative.  Follow-up today.  She reports she was seen in the emergency room on 09/29/2022 for chest discomfort.  She describes spasms in the left chest.  Also in the left shoulder and left arm.  She also reports left jaw discomfort.  She apparently had done wall Pilates.  This was a new exercise for her.  She was doing exercise she is not used to doing.  She went to the emergency room.  Troponins were negative.  EKG was normal.  Her chest x-ray was normal as well.  All of her lab work was normal.  She has never had a heart attack or stroke.  There is no strong family history of heart disease.  Cholesterol slightly elevated but she had a coronary calcium score in January 2024 that was 0.  She does not smoke.  She reports alcohol in moderation.  She is a Engineer, technical sales for [employer].  CV exam normal.  She has 1 son.  She is several grandchildren.  She seems to be quite healthy and without complaints today.  She has had no further chest discomfort.  She has stopped doing well Pilates.  Past Medical History: Past Medical History:  Diagnosis Date   Breast density    Hypertension    Pelvic pain    Thyroid disease    Yeast vaginitis     Past Surgical History: Past Surgical History:  Procedure Laterality Date   ABDOMINAL HYSTERECTOMY      Current Medications: Current Meds  Medication Sig   Albuterol-Budesonide (AIRSUPRA) 90-80 MCG/ACT AERO Inhale 90 mcg into the lungs  2 (two) times daily as needed.   amLODipine (NORVASC) 5 MG tablet TAKE 1 TABLET (5 MG TOTAL) BY MOUTH DAILY.   SYNTHROID 88 MCG tablet TAKE 1 TABLET BY MOUTH MON - FRI AND 1/2 TAB ON SAT   Vitamin D, Ergocalciferol, (DRISDOL) 1.25 MG (50000 UNIT) CAPS capsule TAKE 1 CAPSULE BY ORAL ROUTE once weekly     Allergies:    Peanuts [peanut oil]   Social History: Social History   Socioeconomic History   Marital status: Married    Spouse name: Not on file   Number of children: 1   Years of education: Not on file   Highest education level: Doctorate  Occupational History   Occupation: [employer]  Tobacco Use   Smoking status: Never   Smokeless tobacco: Never  Vaping Use   Vaping Use: Never used  Substance and Sexual Activity   Alcohol use: No   Drug use: No   Sexual activity: Not on file    Comment: hysterectomy   Other Topics Concern   Not on file  Social History Narrative   Not on file   Social Determinants of Health   Financial Resource Strain: Low Risk  (09/14/2022)   Overall Financial Resource Strain (CARDIA)  Difficulty of Paying Living Expenses: Not hard at all  Food Insecurity: No Food Insecurity (09/14/2022)   Hunger Vital Sign    Worried About Running Out of Food in the Last Year: Never true    Ran Out of Food in the Last Year: Never true  Transportation Needs: No Transportation Needs (09/14/2022)   PRAPARE - Administrator, Civil Service (Medical): No    Lack of Transportation (Non-Medical): No  Physical Activity: Insufficiently Active (09/14/2022)   Exercise Vital Sign    Days of Exercise per Week: 2 days    Minutes of Exercise per Session: 20 min  Stress: No Stress Concern Present (09/14/2022)   [employer] of Occupational Health - Occupational Stress Questionnaire    Feeling of Stress : Not at all  Social Connections: Socially Integrated (09/14/2022)   Social Connection and Isolation Panel [NHANES]    Frequency of  Communication with Friends and Family: Three times a week    Frequency of Social Gatherings with Friends and Family: Twice a week    Attends Religious Services: 1 to 4 times per year    Active Member of [employer] or Organizations: Yes    Attends Banker Meetings: 1 to 4 times per year    Marital Status: Married     Family History: The patient's family history includes Allergies in her father; Asthma in her mother; COPD in her mother; Cancer in her father.  ROS:   All other ROS reviewed and negative. Pertinent positives noted in the HPI.     EKGs/Labs/Other Studies Reviewed:   The following studies were personally reviewed by me today:  EKG:  EKG is  ordered today.  The ekg ordered today demonstrates normal sinus rhythm heart 74, no acute ischemic changes or evidence of infarct, and was personally reviewed by me.   Recent Labs: 04/03/2022: TSH 0.457 09/29/2022: ALT 14; BUN 12; Creatinine, Ser 0.85; Hemoglobin 14.6; Platelets 326; Potassium 3.6; Sodium 141   Recent Lipid Panel    Component Value Date/Time   CHOL 243 (H) 06/19/2022 1519   TRIG 125 06/19/2022 1519   HDL 51 06/19/2022 1519   CHOLHDL 4.8 (H) 06/19/2022 1519   LDLCALC 170 (H) 06/19/2022 1519    Physical Exam:   VS:  BP 120/72 (BP Location: Left Arm, Patient Position: Sitting, Cuff Size: Normal)   Pulse 74   Ht 5' 3.5" (1.613 m)   Wt 163 lb 3.2 oz (74 kg)   SpO2 92%   BMI 28.46 kg/m    Wt Readings from Last 3 Encounters:  10/31/22 163 lb 3.2 oz (74 kg)  09/29/22 159 lb (72.1 kg)  09/15/22 164 lb (74.4 kg)    General: Well nourished, well developed, in no acute distress Head: Atraumatic, normal size  Eyes: PEERLA, EOMI  Neck: Supple, no JVD Endocrine: No thryomegaly Cardiac: Normal S1, S2; RRR; no murmurs, rubs, or gallops Lungs: Clear to auscultation bilaterally, no wheezing, rhonchi or rales  Abd: Soft, nontender, no hepatomegaly  Ext: No edema, pulses 2+ Musculoskeletal: No deformities, BUE  and BLE strength normal and equal Skin: Warm and dry, no rashes   Neuro: Alert and oriented to person, place, time, and situation, CNII-XII grossly intact, no focal deficits  Psych: Normal mood and affect   ASSESSMENT:   Kaitlyn Terry is a 62 y.o. female who presents for the following: 1. Precordial pain     PLAN:   1. Precordial pain -Symptoms of muscle spasm in the left  chest and left shoulder and arm.  Suspect this is musculoskeletal.  Occurred with starting a new workout regimen.  Emergency room visit was negative for cardiac event.  Troponins negative.  EKG normal.  Coronary calcium scoring in January with value of 0.  Overall, she is extremely low risk given her calcium score of 0.  We did discuss that she has had no further recurrence of symptoms.  I have low suspicion for cardiac etiology.  With a calcium score of 0 and a normal EKG she can defer testing.  She has opted for this today.  If symptoms do recur we can always reevaluate for testing but at this time I would not.      Disposition: Return if symptoms worsen or fail to improve.  Medication Adjustments/Labs and Tests Ordered: Current medicines are reviewed at length with the patient today.  Concerns regarding medicines are outlined above.  No orders of the defined types were placed in this encounter.  No orders of the defined types were placed in this encounter.   Patient Instructions  Medication Instructions:  The current medical regimen is effective;  continue present plan and medications.  *If you need a refill on your cardiac medications before your next appointment, please call your pharmacy*  Follow-Up: At [facility], you and your health needs are our priority.  As part of our continuing mission to provide you with exceptional heart care, we have created designated Provider Care Teams.  These Care Teams include your primary Cardiologist (physician) and Advanced Practice Providers (APPs -  Physician  Assistants and Nurse Practitioners) who all work together to provide you with the care you need, when you need it.  We recommend signing up for the patient portal called "MyChart".  Sign up information is provided on this After Visit Summary.  MyChart is used to connect with patients for Virtual Visits (Telemedicine).  Patients are able to view lab/test results, encounter notes, upcoming appointments, etc.  Non-urgent messages can be sent to your provider as well.   To learn more about what you can do with MyChart, go to [email or web address].    Your next appointment:   As needed  Provider:   Lennie Odor, MD       Signed, Lenna Gilford. Flora Lipps, MD, [facility]  [facility]  [address] [place], [place] [address] [phone]  10/31/2022 1:39 PM

## 2022-10-31 ENCOUNTER — Encounter: Payer: Self-pay | Admitting: Cardiovascular Disease

## 2022-10-31 ENCOUNTER — Ambulatory Visit: Payer: BC Managed Care – PPO | Attending: Cardiovascular Disease | Admitting: Cardiovascular Disease

## 2022-10-31 VITALS — BP 120/72 | HR 74 | Ht 63.5 in | Wt 163.2 lb

## 2022-10-31 DIAGNOSIS — R072 Precordial pain: Secondary | ICD-10-CM | POA: Diagnosis not present

## 2022-10-31 NOTE — Addendum Note (Signed)
Addended by: Berlinda Last on: 10/31/2022 03:46 PM   Modules accepted: Orders

## 2022-10-31 NOTE — Patient Instructions (Signed)
Medication Instructions:  The current medical regimen is effective;  continue present plan and medications.  *If you need a refill on your cardiac medications before your next appointment, please call your pharmacy*   Follow-Up: At [facility], you and your health needs are our priority.  As part of our continuing mission to provide you with exceptional heart care, we have created designated Provider Care Teams.  These Care Teams include your primary Cardiologist (physician) and Advanced Practice Providers (APPs -  Physician Assistants and Nurse Practitioners) who all work together to provide you with the care you need, when you need it.  We recommend signing up for the patient portal called "MyChart".  Sign up information is provided on this After Visit Summary.  MyChart is used to connect with patients for Virtual Visits (Telemedicine).  Patients are able to view lab/test results, encounter notes, upcoming appointments, etc.  Non-urgent messages can be sent to your provider as well.   To learn more about what you can do with MyChart, go to [email or web address].    Your next appointment:   As needed  Provider:    O'Neal, MD   

## 2022-12-03 ENCOUNTER — Other Ambulatory Visit: Payer: Self-pay

## 2022-12-03 DIAGNOSIS — E039 Hypothyroidism, unspecified: Secondary | ICD-10-CM

## 2022-12-03 MED ORDER — SYNTHROID 88 MCG PO TABS
ORAL_TABLET | ORAL | 2 refills | Status: DC
Start: 2022-12-03 — End: 2022-12-03

## 2022-12-03 MED ORDER — SYNTHROID 88 MCG PO TABS
ORAL_TABLET | ORAL | 2 refills | Status: DC
Start: 2022-12-03 — End: 2023-05-04

## 2022-12-18 ENCOUNTER — Ambulatory Visit: Payer: BC Managed Care – PPO | Admitting: Internal Medicine

## 2022-12-25 ENCOUNTER — Encounter: Payer: Self-pay | Admitting: Nurse Practitioner

## 2022-12-25 ENCOUNTER — Ambulatory Visit: Payer: BC Managed Care – PPO | Admitting: Nurse Practitioner

## 2022-12-25 VITALS — BP 120/70 | HR 69 | Temp 98.1°F | Ht 63.0 in | Wt 162.0 lb

## 2022-12-25 DIAGNOSIS — E663 Overweight: Secondary | ICD-10-CM

## 2022-12-25 DIAGNOSIS — Z6828 Body mass index (BMI) 28.0-28.9, adult: Secondary | ICD-10-CM | POA: Diagnosis not present

## 2022-12-25 DIAGNOSIS — J0120 Acute ethmoidal sinusitis, unspecified: Secondary | ICD-10-CM | POA: Insufficient documentation

## 2022-12-25 LAB — POC COVID19 BINAXNOW: SARS Coronavirus 2 Ag: NEGATIVE

## 2022-12-25 MED ORDER — TRIAMCINOLONE ACETONIDE 40 MG/ML IJ SUSP
60.0000 mg | Freq: Once | INTRAMUSCULAR | Status: AC
Start: 2022-12-25 — End: 2022-12-25
  Administered 2022-12-25: 60 mg via INTRAMUSCULAR

## 2022-12-25 NOTE — Progress Notes (Signed)
Madelaine Bhat, CMA,acting as a Neurosurgeon for Arnette Felts, FNP.,have documented all relevant documentation on the behalf of Arnette Felts, FNP,as directed by  Arnette Felts, FNP while in the presence of Arnette Felts, FNP.  Subjective:  Patient ID: Kaitlyn Terry , female    DOB: January 20, 1961 , 62 y.o.   MRN: [id number]  Chief Complaint  Patient presents with   Sinus Problem    HPI  Patient presents today for sinus drainage, pain in right ear, headache and sinus pain. Patient reports it first started Saturday. Patient reports she hasn't tried OTC because it makes her drowsy.   BP Readings from Last 3 Encounters: 12/25/22 : 120/70 10/31/22 : 120/72 09/29/22 : 129/74       Past Medical History:  Diagnosis Date   Acute non-recurrent maxillary sinusitis 04/03/2022   Breast density    Hypertension    Pelvic pain    Thyroid disease    Yeast vaginitis      Family History  Problem Relation Age of Onset   COPD Mother    Asthma Mother    Cancer Father    Allergies Father      Current Outpatient Medications:    Albuterol-Budesonide (AIRSUPRA) 90-80 MCG/ACT AERO, Inhale 90 mcg into the lungs 2 (two) times daily as needed., Disp: 10 g, Rfl: 1   amLODipine (NORVASC) 5 MG tablet, TAKE 1 TABLET (5 MG TOTAL) BY MOUTH DAILY., Disp: 90 tablet, Rfl: 1   SYNTHROID 88 MCG tablet, TAKE 1 TABLET BY MOUTH MON - FRI AND 1/2 TAB ON SAT Strength: , Disp: 78 tablet, Rfl: 2   Vitamin D, Ergocalciferol, (DRISDOL) 1.25 MG (50000 UNIT) CAPS capsule, TAKE 1 CAPSULE BY ORAL ROUTE once weekly, Disp: 12 capsule, Rfl: 1   fluticasone (FLONASE) 50 MCG/ACT nasal spray, Place 1 spray into both nostrils daily., Disp: 15.8 g, Rfl: 1   Allergies  Allergen Reactions   Peanuts [Peanut Oil]      Review of Systems  Constitutional: Negative.  Negative for fatigue.  HENT:  Positive for ear pain (right ear), sinus pressure and sinus pain. Negative for congestion, postnasal drip and sore throat.   Eyes: Negative.    Respiratory: Negative.  Negative for wheezing.   Cardiovascular: Negative.   Gastrointestinal: Negative.   Musculoskeletal: Negative.   All other systems reviewed and are negative.    Today's Vitals   12/25/22 0828  BP: 120/70  Pulse: 69  Temp: 98.1 F (36.7 C)  TempSrc: Oral  Weight: 162 lb (73.5 kg)  Height: 5\' 3"  (1.6 m)  PainSc: 4   PainLoc: Head   Body mass index is 28.7 kg/m.  Wt Readings from Last 3 Encounters:  12/25/22 162 lb (73.5 kg)  10/31/22 163 lb 3.2 oz (74 kg)  09/29/22 159 lb (72.1 kg)    The 10-year ASCVD risk score (Arnett DK, et al., 2019) is: 7.5%   Values used to calculate the score:     Age: 18 years     Sex: Female     Is Non-Hispanic African American: Yes     Diabetic: No     Tobacco smoker: No     Systolic Blood Pressure: 120 mmHg     Is BP treated: Yes     HDL Cholesterol: 51 mg/dL     Total Cholesterol: 243 mg/dL  Objective:  Physical Exam Vitals reviewed.  Constitutional:      General: She is not in acute distress.    Appearance: Normal appearance.  Cardiovascular:     Rate and Rhythm: Normal rate and regular rhythm.     Pulses: Normal pulses.     Heart sounds: Normal heart sounds. No murmur heard. Pulmonary:     Effort: Pulmonary effort is normal. No respiratory distress.     Breath sounds: Normal breath sounds. No wheezing.  Skin:    General: Skin is warm and dry.  Neurological:     General: No focal deficit present.     Mental Status: She is alert and oriented to person, place, and time.     Cranial Nerves: No cranial nerve deficit.     Motor: No weakness.  Psychiatric:        Mood and Affect: Mood normal.        Behavior: Behavior normal.        Thought Content: Thought content normal.        Judgment: Judgment normal.         Assessment And Plan:  Acute non-recurrent ethmoidal sinusitis Assessment & Plan: Rapid covid negative. Kenalog 60 mg IM given. Continue with flonase as needed.   Orders: -      Triamcinolone Acetonide -     POC COVID-19 BinaxNow  Overweight with body mass index (BMI) of 28 to 28.9 in adult    Return if symptoms worsen or fail to improve.  Patient was given opportunity to ask questions. Patient verbalized understanding of the plan and was able to repeat key elements of the plan. All questions were answered to their satisfaction.    Jeanell Sparrow, FNP, have reviewed all documentation for this visit. The documentation on 12/25/22 for the exam, diagnosis, procedures, and orders are all accurate and complete.   IF YOU HAVE BEEN REFERRED TO A SPECIALIST, IT MAY TAKE 1-2 WEEKS TO SCHEDULE/PROCESS THE REFERRAL. IF YOU HAVE NOT HEARD FROM US/SPECIALIST IN TWO WEEKS, PLEASE GIVE [place] A CALL AT [phone] X [phone].

## 2022-12-25 NOTE — Assessment & Plan Note (Signed)
Rapid covid negative. Kenalog 60 mg IM given. Continue with flonase as needed.

## 2023-01-14 LAB — HM MAMMOGRAPHY

## 2023-01-27 NOTE — Patient Instructions (Signed)

## 2023-01-28 ENCOUNTER — Encounter: Payer: Self-pay | Admitting: Internal Medicine

## 2023-01-28 ENCOUNTER — Ambulatory Visit: Payer: BC Managed Care – PPO | Admitting: Internal Medicine

## 2023-01-28 VITALS — BP 106/68 | HR 80 | Ht 63.0 in | Wt 161.2 lb

## 2023-01-28 DIAGNOSIS — J329 Chronic sinusitis, unspecified: Secondary | ICD-10-CM | POA: Diagnosis not present

## 2023-01-28 DIAGNOSIS — I10 Essential (primary) hypertension: Secondary | ICD-10-CM

## 2023-01-28 DIAGNOSIS — R7309 Other abnormal glucose: Secondary | ICD-10-CM

## 2023-01-28 DIAGNOSIS — E039 Hypothyroidism, unspecified: Secondary | ICD-10-CM | POA: Diagnosis not present

## 2023-01-28 DIAGNOSIS — E7800 Pure hypercholesterolemia, unspecified: Secondary | ICD-10-CM | POA: Insufficient documentation

## 2023-01-28 DIAGNOSIS — Z6828 Body mass index (BMI) 28.0-28.9, adult: Secondary | ICD-10-CM

## 2023-01-28 DIAGNOSIS — J301 Allergic rhinitis due to pollen: Secondary | ICD-10-CM

## 2023-01-28 DIAGNOSIS — E663 Overweight: Secondary | ICD-10-CM

## 2023-01-28 DIAGNOSIS — E559 Vitamin D deficiency, unspecified: Secondary | ICD-10-CM

## 2023-01-28 NOTE — Progress Notes (Signed)
I,Victoria T Deloria Lair, CMA,acting as a Neurosurgeon for [name], MD.,have documented all relevant documentation on the behalf of [name], MD,as directed by  [name], MD while in the presence of [name], MD.  Subjective:  Patient ID: Kaitlyn Terry , female    DOB: March 14, 1961 , 62 y.o.   MRN: [id number]  Chief Complaint  Patient presents with   Hypothyroidism   Hypertension   Hyperlipidemia    HPI  Patient presents today for thyroid, bp & cholesterol follow up. She reports compliance with medications. Denies chest pain, and SOB.   She reports for the past week or so she has experienced headache & ear ache. She also has postnasal drip.  At home she has used Sudafed. She admits this helped a little with the drainage. She does not take allergy medication on a regular basis. Denies having fever or chills.      Past Medical History:  Diagnosis Date   Acute non-recurrent maxillary sinusitis 04/03/2022   Breast density    Hypertension    Pelvic pain    Thyroid disease    Yeast vaginitis      Family History  Problem Relation Age of Onset   COPD Mother    Asthma Mother    Cancer Father    Allergies Father      Current Outpatient Medications:    Albuterol-Budesonide (AIRSUPRA) 90-80 MCG/ACT AERO, Inhale 90 mcg into the lungs 2 (two) times daily as needed., Disp: 10 g, Rfl: 1   amLODipine (NORVASC) 5 MG tablet, TAKE 1 TABLET (5 MG TOTAL) BY MOUTH DAILY., Disp: 90 tablet, Rfl: 1   SYNTHROID 88 MCG tablet, TAKE 1 TABLET BY MOUTH MON - FRI AND 1/2 TAB ON SAT Strength: , Disp: 78 tablet, Rfl: 2   Vitamin D, Ergocalciferol, (DRISDOL) 1.25 MG (50000 UNIT) CAPS capsule, TAKE 1 CAPSULE BY ORAL ROUTE once weekly, Disp: 12 capsule, Rfl: 1   fluticasone (FLONASE) 50 MCG/ACT nasal spray, Place 1 spray into both nostrils daily., Disp: 15.8 g, Rfl: 1   Allergies  Allergen Reactions   Peanuts [Peanut Oil]      Review of Systems  Constitutional: Negative.   HENT:   Positive for postnasal drip.   Respiratory: Negative.    Cardiovascular: Negative.   Gastrointestinal: Negative.   Musculoskeletal: Negative.   Neurological:  Positive for headaches.  Psychiatric/Behavioral: Negative.       Today's Vitals   01/28/23 1442  BP: 106/68  Pulse: 80  SpO2: 98%  Weight: 161 lb 3.2 oz (73.1 kg)  Height: 5\' 3"  (1.6 m)   Body mass index is 28.56 kg/m.  Wt Readings from Last 3 Encounters:  01/28/23 161 lb 3.2 oz (73.1 kg)  12/25/22 162 lb (73.5 kg)  10/31/22 163 lb 3.2 oz (74 kg)     Objective:  Physical Exam Vitals and nursing note reviewed.  Constitutional:      Appearance: Normal appearance.  HENT:     Head: Normocephalic and atraumatic.     Right Ear: Tympanic membrane, ear canal and external ear normal. There is no impacted cerumen.     Left Ear: Tympanic membrane, ear canal and external ear normal. There is no impacted cerumen.  Eyes:     Extraocular Movements: Extraocular movements intact.  Cardiovascular:     Rate and Rhythm: Normal rate and regular rhythm.     Heart sounds: Normal heart sounds.  Pulmonary:     Effort: Pulmonary effort is normal.  Breath sounds: Normal breath sounds.  Musculoskeletal:     Cervical back: Normal range of motion.  Skin:    General: Skin is warm.  Neurological:     General: No focal deficit present.     Mental Status: She is alert.  Psychiatric:        Mood and Affect: Mood normal.        Behavior: Behavior normal.         Assessment And Plan:  Primary hypothyroidism Assessment & Plan: Chronic, she is currently taking Synthroid 88cmg M-F and 1/2 tab on Saturdays. I will check thyroid panel and adjust meds as needed.   Orders: -     TSH + free T4  Essential hypertension, benign Assessment & Plan: Chronic, well controlled. For now, she will continue with amlodipine 5mg  daily. I will check renal function today. She will rto in 4-6 months for re-evaluation.   Orders: -      CMP14+EGFR  Recurrent sinusitis Assessment & Plan: I do not think she currently has a sinus infection; however, she does get them frequently.  She is encouraged to decrease her dairy intake and to stay well hydrated. May need to consider ENT evaluation in the future.   Orders: -     Ambulatory referral to Allergy  Non-seasonal allergic rhinitis due to pollen Assessment & Plan: She was given samples of Zyrtec 10mg  to take nightly. She will let me know if her sx persist. I will also refer her to Allergy for further evaluation. She agrees with treatment plan. She will let me know if her sx persist/worsen.   Orders: -     Ambulatory referral to Allergy  Other abnormal glucose Assessment & Plan: Previous labs reviewed, her A1c has been elevated in the past. I will check an A1c today. Reminded to avoid refined sugars including sugary drinks/foods and processed meats including bacon, sausages and deli meats.    Orders: -     Hemoglobin A1c  Overweight with body mass index (BMI) of 28 to 28.9 in adult Assessment & Plan: She is encouraged to aim for at least 150 minutes of exercise/week.       Return if symptoms worsen or fail to improve.  Patient was given opportunity to ask questions. Patient verbalized understanding of the plan and was able to repeat key elements of the plan. All questions were answered to their satisfaction.   I, [name], MD, have reviewed all documentation for this visit. The documentation on 01/28/23 for the exam, diagnosis, procedures, and orders are all accurate and complete.   IF YOU HAVE BEEN REFERRED TO A SPECIALIST, IT MAY TAKE 1-2 WEEKS TO SCHEDULE/PROCESS THE REFERRAL. IF YOU HAVE NOT HEARD FROM US/SPECIALIST IN TWO WEEKS, PLEASE GIVE [place] A CALL AT [phone].   THE PATIENT IS ENCOURAGED TO PRACTICE SOCIAL DISTANCING DUE TO THE COVID-19 PANDEMIC.

## 2023-01-29 LAB — CMP14+EGFR
ALT: 14 IU/L (ref 0–32)
AST: 14 IU/L (ref 0–40)
Albumin: 4.2 g/dL (ref 3.9–4.9)
Alkaline Phosphatase: 110 IU/L (ref 44–121)
BUN/Creatinine Ratio: 10 — ABNORMAL LOW (ref 12–28)
BUN: 11 mg/dL (ref 8–27)
Bilirubin Total: 0.4 mg/dL (ref 0.0–1.2)
CO2: 29 mmol/L (ref 20–29)
Calcium: 9.8 mg/dL (ref 8.7–10.3)
Chloride: 99 mmol/L (ref 96–106)
Creatinine, Ser: 1.06 mg/dL — ABNORMAL HIGH (ref 0.57–1.00)
Globulin, Total: 3 g/dL (ref 1.5–4.5)
Glucose: 71 mg/dL (ref 70–99)
Potassium: 4.8 mmol/L (ref 3.5–5.2)
Sodium: 141 mmol/L (ref 134–144)
Total Protein: 7.2 g/dL (ref 6.0–8.5)
eGFR: 60 mL/min/{1.73_m2} (ref >59)

## 2023-01-29 LAB — HEMOGLOBIN A1C
Est. average glucose Bld gHb Est-mCnc: 131 mg/dL
Hgb A1c MFr Bld: 6.2 % — ABNORMAL HIGH (ref 4.8–5.6)

## 2023-01-29 LAB — TSH+FREE T4
Free T4: 1.46 ng/dL (ref 0.82–1.77)
TSH: 0.648 u[IU]/mL (ref 0.450–4.500)

## 2023-01-31 DIAGNOSIS — J301 Allergic rhinitis due to pollen: Secondary | ICD-10-CM | POA: Insufficient documentation

## 2023-01-31 NOTE — Assessment & Plan Note (Signed)
Chronic, she is currently taking Synthroid 88cmg M-F and 1/2 tab on Saturdays. I will check thyroid panel and adjust meds as needed.

## 2023-01-31 NOTE — Assessment & Plan Note (Signed)
She was given samples of Zyrtec 10mg  to take nightly. She will let me know if her sx persist. I will also refer her to Allergy for further evaluation. She agrees with treatment plan. She will let me know if her sx persist/worsen.

## 2023-01-31 NOTE — Assessment & Plan Note (Signed)
I do not think she currently has a sinus infection; however, she does get them frequently.  She is encouraged to decrease her dairy intake and to stay well hydrated. May need to consider ENT evaluation in the future.

## 2023-01-31 NOTE — Assessment & Plan Note (Signed)
Chronic, well controlled. For now, she will continue with amlodipine 5mg  daily. I will check renal function today. She will rto in 4-6 months for re-evaluation.

## 2023-01-31 NOTE — Assessment & Plan Note (Signed)
Previous labs reviewed, her A1c has been elevated in the past. I will check an A1c today. Reminded to avoid refined sugars including sugary drinks/foods and processed meats including bacon, sausages and deli meats.  

## 2023-01-31 NOTE — Assessment & Plan Note (Signed)
She is encouraged to aim for at least 150 minutes of exercise/week.

## 2023-02-03 ENCOUNTER — Encounter: Payer: Self-pay | Admitting: Internal Medicine

## 2023-03-24 ENCOUNTER — Encounter: Payer: Self-pay | Admitting: Internal Medicine

## 2023-03-24 ENCOUNTER — Ambulatory Visit: Payer: BC Managed Care – PPO | Admitting: Internal Medicine

## 2023-03-24 VITALS — BP 116/70 | HR 96 | Temp 97.7°F | Ht 64.0 in | Wt 164.0 lb

## 2023-03-24 DIAGNOSIS — J4520 Mild intermittent asthma, uncomplicated: Secondary | ICD-10-CM

## 2023-03-24 DIAGNOSIS — H04123 Dry eye syndrome of bilateral lacrimal glands: Secondary | ICD-10-CM

## 2023-03-24 DIAGNOSIS — B999 Unspecified infectious disease: Secondary | ICD-10-CM

## 2023-03-24 DIAGNOSIS — J3089 Other allergic rhinitis: Secondary | ICD-10-CM | POA: Diagnosis not present

## 2023-03-24 MED ORDER — MONTELUKAST SODIUM 10 MG PO TABS: 10.0000 mg | ORAL_TABLET | Freq: Every day | ORAL | 1 refills | Status: DC

## 2023-03-24 MED ORDER — IPRATROPIUM BROMIDE 0.06 % NA SOLN: 2.0000 | Freq: Four times a day (QID) | NASAL | 12 refills | Status: DC

## 2023-03-24 MED ORDER — AZELASTINE-FLUTICASONE 137-50 MCG/ACT NA SUSP: 1.0000 | Freq: Two times a day (BID) | NASAL | 5 refills | Status: AC

## 2023-03-24 NOTE — Progress Notes (Signed)
NEW PATIENT Date of Service/Encounter:  03/24/23 Referring provider: Dorothyann Peng, MD Primary care provider: Dorothyann Peng, MD  Subjective:  Kaitlyn Terry is a 62 y.o. female presenting for evaluation of chronic rhinitis and chronic sinusitis History obtained from: chart review and patient.   Discussed the use of AI scribe software for clinical note transcription with the patient, who gave verbal consent to proceed.  History of Present Illness   The patient, with a history of recurrent sinus infections and allergies, reports worsening symptoms since moving to [place] in 2006. They describe experiencing runny noses, headaches, facial pain, and nasal stuffiness. Notably, they recall an episode two years ago in [place] where they woke up unable to breathe through their nose, which was temporarily relieved with Flonase. They deny any changes in taste or smell but report dry, crusty eyes in the morning.  She does have a diagnosis of dry eye syndrome.   The patient has required antibiotics and steroids at least four times a year for sinus infections. They note that exposure to dust exacerbates their allergy symptoms, leading to increased sneezing. 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 They have avoided all nuts since their last reaction and can identify when food has been cooked in peanut oil due to a scratchy sensation in their tongue. They underwent allergy testing prior to receiving allergy shots in 2007-2008, but they are unsure if nuts were included in the panel.   She completed 1 year of AIT.  Returns to Allergy now due to increased frequency of sinus infections.   The patient also reports persistent symptoms between infections, with changes in seasons and extreme temperature shifts triggering their symptoms. They have been prescribed Airsupra for coughing during URI, which they find helpful, and Flonase for their rhinosinusitis  symptoms. They have also received prednisone shots and various antibiotics. Their primary care provider recommended they seek allergist consultation due to the frequency of their sinus infections.        Chart Review:  Previous allergy testing in 2007 to 2008, was on AIT for 1 year.  Other allergy screening: Asthma:  Will need air supra during sinus infections Rhino conjunctivitis: yes Food allergy: no Medication allergy: no Hymenoptera allergy: no Urticaria: no Eczema:no History of recurrent infections suggestive of immunodeficency:  maybe: 4 sinus infections per year  Vaccinations are up to date.   Past Medical History: Past Medical History:  Diagnosis Date   Acute non-recurrent maxillary sinusitis 04/03/2022   Breast density    Hypertension    Pelvic pain    Thyroid disease    Yeast vaginitis    Medication List:  Current Outpatient Medications  Medication Sig Dispense Refill   Albuterol-Budesonide (AIRSUPRA) 90-80 MCG/ACT AERO Inhale 90 mcg into the lungs 2 (two) times daily as needed. 10 g 1   amLODipine (NORVASC) 5 MG tablet TAKE 1 TABLET (5 MG TOTAL) BY MOUTH DAILY. 90 tablet 1   Azelastine-Fluticasone 137-50 MCG/ACT SUSP Place 1 spray into the nose in the morning and at bedtime. 23 g 5   ipratropium (ATROVENT) 0.06 % nasal spray Place 2 sprays into both nostrils 4 (four) times daily. 15 mL 12   montelukast (SINGULAIR) 10 MG tablet Take 1 tablet (10 mg total) by mouth at bedtime. 90 tablet 1   SYNTHROID 88 MCG tablet TAKE 1 TABLET BY MOUTH MON - FRI AND 1/2 TAB ON SAT Strength: 78 tablet 2   Vitamin D,  Ergocalciferol, (DRISDOL) 1.25 MG (50000 UNIT) CAPS capsule TAKE 1 CAPSULE BY ORAL ROUTE once weekly 12 capsule 1   fluticasone (FLONASE) 50 MCG/ACT nasal spray Place 1 spray into both nostrils daily. 15.8 g 1   No current facility-administered medications for this visit.   Known Allergies:  Allergies  Allergen Reactions   Peanuts [Peanut Oil]     Positive  testing in the past. Has had vomiting.    Past Surgical History: Past Surgical History:  Procedure Laterality Date   ABDOMINAL HYSTERECTOMY     Family History: Family History  Problem Relation Age of Onset   COPD Mother    Asthma Mother    Cancer Father    Allergies Father    Social History: [place] lives single-family home that is [address] years old.  No water damage in the house, hardwood's in the family room carpets in the bedroom, electric heat and central cooling.  No roaches in the house and best to get off the floor no dust mite precautions.  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   ROS:  All other systems negative except as noted per HPI.  Objective:  Blood pressure 116/70, pulse 96, temperature 97.7 F (36.5 C), temperature source Temporal, height 5\' 4"  (1.626 m), weight 164 lb (74.4 kg), SpO2 98%. Body mass index is 28.15 kg/m. Physical Exam:  General Appearance:  Alert, cooperative, no distress, appears stated age  Head:  Normocephalic, without obvious abnormality, atraumatic  Eyes:  Conjunctiva clear, EOM's intact  Ears EACs normal bilaterally and normal TMs bilaterally  Nose: Nares normal, normal mucosa, no visible anterior polyps, and septum midline  Throat: Lips, tongue normal; teeth and gums normal, normal posterior oropharynx  Neck: Supple, symmetrical  Lungs:   clear to auscultation bilaterally, Respirations unlabored, no coughing  Heart:  regular rate and rhythm and no murmur, Appears well perfused  Extremities: No edema  Skin: Skin color, texture, turgor normal and no rashes or lesions on visualized portions of skin  Neurologic: No gross deficits   Diagnostics: Spirometry:  Tracings reviewed. Her effort: Variable effort-results affected FVC: 2.53 L (pre), 1.36 L  (post) FEV1: 1.43 L, 67% predicted (pre), 2.12 L, 64% predicted  (post) FEV1/FVC ratio: 57 (pre), 100 (post) Interpretation: Spirometry consistent with moderate obstructive disease.  Although significantly variable effort.  After 4 puffs of albuterol there is no significant postbronchodilator response Please see scanned spirometry results for details.  Labs:  Lab Orders  No laboratory test(s) ordered today     Assessment and Plan  Assessment and Plan    Recurrent Sinus Infections  Frequent sinus infections requiring antibiotics and steroids. Persistent symptoms including runny nose, headaches, facial pain, and nasal congestion. Symptoms worsen with dust exposure and temperature changes.  Plan:  -Plan for allergy skin prick testing (1-55, PN and TN)  -Consider immune evaluation if allergy testing is negative   Chronic Rhinitis: not well controlled  Symptoms of runny nose and sneezing, exacerbated by dust. Previous allergy shots in 2007-2008.  Plan:  -Start Dymista nasal spray: use 1-2 sprays in each nostril daily  -Start Singulair 10 mg by mouth at night -Consider daily antihistamine such as Zyrtec 10 mg, Allegra 180 mg, Claritin 10 mg daily as needed  (hold for 3 days prior to skin test)  Possible Nut Allergy Reports of vomiting after ingestion of nuts  and aerosolized symptoms with exposure to peanut oil. Avoidance of all nuts.  -Plan for allergy testing for peanuts and tree nuts. -Advise to continue avoidance of peanuts and tree nuts until testing. -Consider EpiPen prescription if testing is positive  Reactive Airway Disease: mild intermittent Use of Airsupra for cough. Improvement noted with use; triggered by URI   -Breathing test today was difficult to interpret and showed no reversibility with albuterol  -Continue to air supra 2 puffs every 4 hours as needed for cough, wheeze, dyspnea  -Do not use more than 12 puffs in 24 hours  - Rinse mouth after use   Follow up: in 1 week for skin test (1-55, all nuts)     This note in its  entirety was forwarded to the Provider who requested this consultation.  Other: reviewed spirometry technique  Thank you for your kind referral. I appreciate the opportunity to take part in Quinita's care. Please do not hesitate to contact me with questions.  Sincerely,  Thank you so much for letting me partake in your care today.  Don't hesitate to reach out if you have any additional concerns!  Ferol Luz, MD  [facility] [place], [place]

## 2023-03-24 NOTE — Addendum Note (Signed)
Addended by: Maryjean Morn D on: 03/24/2023 02:55 PM   Modules accepted: Orders

## 2023-03-24 NOTE — Patient Instructions (Addendum)
Recurrent Sinus Infections  Frequent sinus infections requiring antibiotics and steroids. Persistent symptoms including runny nose, headaches, facial pain, and nasal congestion. Symptoms worsen with dust exposure and temperature changes.  Plan:  -Plan for allergy skin prick testing (1-55, PN and TN)  -Consider immune evaluation if allergy testing is negative   Chronic Rhinitis: not well controlled  Symptoms of runny nose and sneezing, exacerbated by dust. Previous allergy shots in 2007-2008.  Plan:  -Start Dymista nasal spray: use 1-2 sprays in each nostril daily  -Start Singulair 10 mg by mouth at night -Consider daily antihistamine such as Zyrtec 10 mg, Allegra 180 mg, Claritin 10 mg daily as needed  (hold for 3 days prior to skin test)   Possible Nut Allergy Reports of vomiting after ingestion of nuts and aerosolized symptoms with exposure to peanut oil. Avoidance of all nuts.  -Plan for allergy testing for peanuts and tree nuts. -Advise to continue avoidance of peanuts and tree nuts until testing. -Consider EpiPen prescription if testing is positive  Reactive Airway Disease: mild intermittent Use of Airsupra for cough. Improvement noted with use; triggered by URI   -Breathing test today was difficult to interpret and showed no reversibility with albuterol  -Continue to air supra 2 puffs every 4 hours as needed for cough, wheeze, dyspnea  -Do not use more than 12 puffs in 24 hours  - Rinse mouth after use   Follow up: in 1 week for skin test (1-55, all nuts)

## 2023-04-01 ENCOUNTER — Ambulatory Visit: Payer: BC Managed Care – PPO | Admitting: Internal Medicine

## 2023-04-01 DIAGNOSIS — J3089 Other allergic rhinitis: Secondary | ICD-10-CM

## 2023-04-01 DIAGNOSIS — B999 Unspecified infectious disease: Secondary | ICD-10-CM

## 2023-04-01 DIAGNOSIS — H04123 Dry eye syndrome of bilateral lacrimal glands: Secondary | ICD-10-CM

## 2023-04-01 DIAGNOSIS — J4520 Mild intermittent asthma, uncomplicated: Secondary | ICD-10-CM

## 2023-04-01 DIAGNOSIS — T7800XD Anaphylactic reaction due to unspecified food, subsequent encounter: Secondary | ICD-10-CM | POA: Diagnosis not present

## 2023-04-01 DIAGNOSIS — J302 Other seasonal allergic rhinitis: Secondary | ICD-10-CM | POA: Diagnosis not present

## 2023-04-01 DIAGNOSIS — T7800XA Anaphylactic reaction due to unspecified food, initial encounter: Secondary | ICD-10-CM

## 2023-04-01 MED ORDER — EPINEPHRINE 0.3 MG/0.3ML IJ SOAJ: 0.3000 mg | INTRAMUSCULAR | 1 refills | Status: DC | PRN

## 2023-04-01 NOTE — Patient Instructions (Addendum)
Recurrent Sinus Infections  Frequent sinus infections requiring antibiotics and steroids. Persistent symptoms including runny nose, headaches, facial pain, and nasal congestion. Symptoms worsen with dust exposure and temperature changes.  Plan:  Allergy test 04/01/23: Positive to grass, weeds, trees, mold, dust mite, dog, mixed feathers, roach  Chronic Rhinitis: Symptoms of runny nose and sneezing, exacerbated by dust. Previous allergy shots in 2007-2008.  Plan:  -Continue Dymista nasal spray: use 1-2 sprays in each nostril daily  -Continue  Singulair 10 mg by mouth at night -Consider daily antihistamine such as Zyrtec 10 mg, Allegra 180 mg, Claritin 10 mg daily as needed  -Start avoidance measures -Sitter allergy immunotherapy if he would like to reduce lifetime use of medications or if medications stop working  Nut Allergy Reports of vomiting after ingestion of nuts and aerosolized symptoms with exposure to peanut oil. Avoidance of all nuts.  -Allergy test 04/01/23: Positive to peanut, pecan, hazelnut -Advise to continue avoidance of peanuts and tree nuts -EpiPen and food allergy action plan provided today  Reactive Airway Disease: mild intermittent Use of Airsupra for cough. Improvement noted with use; triggered by URI  -Continue to air supra 2 puffs every 4 hours as needed for cough, wheeze, dyspnea  -Do not use more than 12 puffs in 24 hours  - Rinse mouth after use   Follow up: 6 months  Reducing Pollen Exposure  The American Academy of Allergy, Asthma and Immunology suggests the following steps to reduce your exposure to pollen during allergy seasons.    Do not hang sheets or clothing out to dry; pollen may collect on these items. Do not mow lawns or spend time around freshly cut grass; mowing stirs up pollen. Keep windows closed at night.  Keep car windows closed while driving. Minimize morning activities outdoors, a time when pollen counts are usually at their  highest. Stay indoors as much as possible when pollen counts or humidity is high and on windy days when pollen tends to remain in the air longer. Use air conditioning when possible.  Many air conditioners have filters that trap the pollen spores. Use a HEPA room air filter to remove pollen form the indoor air you breathe.  DUST MITE AVOIDANCE MEASURES:  There are three main measures that need and can be taken to avoid house dust mites:  Reduce accumulation of dust in general -reduce furniture, clothing, carpeting, books, stuffed animals, especially in bedroom  Separate yourself from the dust -use pillow and mattress encasements (can be found at stores such as [employer] or online) -avoid direct exposure to air condition flow -use a HEPA filter device, especially in the bedroom; you can also use a HEPA filter vacuum cleaner -wipe dust with a moist towel instead of a dry towel or broom when cleaning  Decrease mites and/or their secretions -wash clothing and linen and stuffed animals at highest temperature possible, at least every 2 weeks -stuffed animals can also be placed in a bag and put in a freezer overnight  Despite the above measures, it is impossible to eliminate dust mites or their allergen completely from your home.  With the above measures the burden of mites in your home can be diminished, with the goal of minimizing your allergic symptoms.  Success will be reached only when implementing and using all means together.  Control of Mold Allergen   Mold and fungi can grow on a variety of surfaces provided certain temperature and moisture conditions exist.  Outdoor molds grow on  plants, decaying vegetation and soil.  The major outdoor mold, Alternaria and Cladosporium, are found in very high numbers during hot and dry conditions.  Generally, a late Summer - Fall peak is seen for common outdoor fungal spores.  Rain will temporarily lower outdoor mold spore count, but counts  rise rapidly when the rainy period ends.  The most important indoor molds are Aspergillus and Penicillium.  Dark, humid and poorly ventilated basements are ideal sites for mold growth.  The next most common sites of mold growth are the bathroom and the kitchen.  Outdoor (Seasonal) Mold Control   Use air conditioning and keep windows closed Avoid exposure to decaying vegetation. Avoid leaf raking. Avoid grain handling. Consider wearing a face mask if working in moldy areas.    Indoor (Perennial) Mold Control    Maintain humidity below 50%. Clean washable surfaces with 5% bleach solution. Remove sources e.g. contaminated carpets.  Control of Cockroach Allergen  Cockroach allergen has been identified as an important cause of acute attacks of asthma, especially in urban settings.  There are fifty-five species of cockroach that exist in the [place], however only three, the [place], [place] species produce allergen that can affect patients with Asthma.  Allergens can be obtained from fecal particles, egg casings and secretions from cockroaches.    Remove food sources. Reduce access to water. Seal access and entry points. Spray runways with 0.5-1% Diazinon or Chlorpyrifos Blow boric acid power under stoves and refrigerator. Place bait stations (hydramethylnon) at feeding sites.  Control of Dog or Cat Allergen  Avoidance is the best way to manage a dog or cat allergy. If you have a dog or cat and are allergic to dog or cats, consider removing the dog or cat from the home. If you have a dog or cat but don't want to find it a new home, or if your family wants a pet even though someone in the household is allergic, here are some strategies that may help keep symptoms at bay:  Keep the pet out of your bedroom and restrict it to only a few rooms. Be advised that keeping the dog or cat in only one room will not limit the allergens to that room. Don't pet, hug or kiss the dog  or cat; if you do, wash your hands with soap and water. High-efficiency particulate air (HEPA) cleaners run continuously in a bedroom or living room can reduce allergen levels over time. Regular use of a high-efficiency vacuum cleaner or a central vacuum can reduce allergen levels. Giving your dog or cat a bath at least once a week can reduce airborne allergen.

## 2023-04-01 NOTE — Progress Notes (Signed)
Date of Service/Encounter:  04/01/23  Allergy testing appointment   Initial visit on 03/24/23, seen for allergic rhinitis .  Please see that note for additional details.  Today reports for allergy diagnostic testing:    DIAGNOSTICS:  Skin Testing: Environmental allergy panel and select foods. Adequate positive and negative controls Results discussed with patient/family.  Airborne Adult Perc - 04/01/23 0836     Time Antigen Placed 0830    Allergen Manufacturer Waynette Buttery    Location Back    Number of Test 55    1. Control-Buffer 50% Glycerol Negative    2. Control-Histamine 2+    3. Bahia Negative    4. French Southern Territories Negative    5. Johnson Negative    6. Kentucky Blue Negative    7. Meadow Fescue 4+    9. Timothy Negative    10. Ragweed Mix Negative    11. Cocklebur Negative    12. Plantain,  English Negative    13. Baccharis Negative    14. Dog Fennel Negative    15. Russian Thistle Negative    16. Lamb's Quarters Negative    17. Sheep Sorrell Negative    18. Rough Pigweed Negative    19. Marsh Elder, Rough Negative    20. Mugwort, Common Negative    21. Box, Elder Negative    22. Cedar, red Negative    23. Sweet Gum Negative    24. Pecan Pollen 2+    25. Pine Mix 2+    26. Walnut, Black Pollen Negative    27. Red Mulberry Negative    28. Ash Mix Negative    29. Birch Mix Negative    30. Beech American Negative    31. Cottonwood, Guinea-Bissau Negative    32. Hickory, White 3+    33. Maple Mix Negative    34. Oak, Guinea-[place]iss[place] Mix Negative    35. Sycamore Eastern Negative    36. Alternaria Alternata 4+    37. Cladosporium Herbarum Negative    38. Aspergillus Mix Negative    39. Penicillium Mix Negative    40. Bipolaris Sorokiniana (Helminthosporium) 4+    41. Drechslera Spicifera (Curvularia) Negative    42. Mucor Plumbeus 2+    43. Fusarium Moniliforme 4+    44. Aureobasidium Pullulans (pullulara) 4+    45. Rhizopus Oryzae Negative    46. Botrytis Cinera Negative    47.  Epicoccum Nigrum 2+    48. Phoma Betae Negative    49. Dust Mite Mix Negative    50. Cat Hair 10,000 BAU/ml Negative    51.  Dog Epithelia Negative    52. Mixed Feathers 2+    53. Horse Epithelia Negative    54. Cockroach, German Negative    55. Tobacco Leaf Negative             Intradermal - 04/01/23 7628     Time Antigen Placed 0900    Allergen Manufacturer Waynette Buttery    Location Arm    Number of Test 11    Control Negative    Bahia 4+    French Southern Territ[place] 3+    Johnson 3+    Ragweed Mix 2+    Weed Mix 2+    Mold 1 4+    Mite Mix 3+    Cat Negative    Dog 2+    Cockroach 3+             Food Adult Perc - 04/01/23 0800     Time Antigen Placed  0830    Allergen Manufacturer Greer    Location Back    Number of allergen test 8    1. Peanut --   10x30   10. Cashew Negative    11. Walnut Food Negative    12. Almond Negative    13. Hazelnut --   5x10   14. Pecan Food --   5x5   15. Pistachio Negative    16. [place] Nut Negative             Allergy testing results were read and interpreted by myself, documented by clinical staff.  Patient provided with copy of allergy testing along with avoidance measures when indicated.   Ferol Luz, MD  [facility]

## 2023-04-02 ENCOUNTER — Telehealth: Payer: Self-pay | Admitting: Internal Medicine

## 2023-04-02 NOTE — Telephone Encounter (Signed)
Pt states she has a rash around the site where she was tested and it is warm, Lyla Son talked to the pt and told her to try Ibuprofen and a compress.

## 2023-04-02 NOTE — Telephone Encounter (Signed)
I did not state ibuprofen. I stated try cold compress and take benadryl if needed along with hydrocortison

## 2023-05-04 ENCOUNTER — Other Ambulatory Visit: Payer: Self-pay

## 2023-05-04 ENCOUNTER — Other Ambulatory Visit: Payer: Self-pay | Admitting: Internal Medicine

## 2023-05-04 ENCOUNTER — Encounter: Payer: Self-pay | Admitting: Internal Medicine

## 2023-05-04 DIAGNOSIS — E039 Hypothyroidism, unspecified: Secondary | ICD-10-CM

## 2023-05-04 MED ORDER — SYNTHROID 88 MCG PO TABS
ORAL_TABLET | ORAL | 0 refills | Status: DC
Start: 2023-05-04 — End: 2023-08-11

## 2023-05-04 MED ORDER — AMLODIPINE BESYLATE 5 MG PO TABS: 5.0000 mg | ORAL_TABLET | Freq: Every day | ORAL | 0 refills | Status: DC

## 2023-07-06 ENCOUNTER — Other Ambulatory Visit: Payer: Self-pay

## 2023-07-06 MED ORDER — AMLODIPINE BESYLATE 5 MG PO TABS: 5.0000 mg | ORAL_TABLET | Freq: Every day | ORAL | 1 refills | Status: DC

## 2023-07-07 ENCOUNTER — Encounter: Payer: BC Managed Care – PPO | Admitting: Internal Medicine

## 2023-08-11 ENCOUNTER — Encounter: Payer: Self-pay | Admitting: Internal Medicine

## 2023-08-11 ENCOUNTER — Ambulatory Visit (INDEPENDENT_AMBULATORY_CARE_PROVIDER_SITE_OTHER): Payer: BC Managed Care – PPO | Admitting: Internal Medicine

## 2023-08-11 VITALS — BP 110/70 | HR 85 | Temp 98.6°F | Ht 64.0 in | Wt 162.2 lb

## 2023-08-11 DIAGNOSIS — E039 Hypothyroidism, unspecified: Secondary | ICD-10-CM | POA: Diagnosis not present

## 2023-08-11 DIAGNOSIS — Z0000 Encounter for general adult medical examination without abnormal findings: Secondary | ICD-10-CM | POA: Insufficient documentation

## 2023-08-11 DIAGNOSIS — I10 Essential (primary) hypertension: Secondary | ICD-10-CM | POA: Diagnosis not present

## 2023-08-11 DIAGNOSIS — J3489 Other specified disorders of nose and nasal sinuses: Secondary | ICD-10-CM | POA: Diagnosis not present

## 2023-08-11 DIAGNOSIS — R7309 Other abnormal glucose: Secondary | ICD-10-CM

## 2023-08-11 DIAGNOSIS — M79641 Pain in right hand: Secondary | ICD-10-CM | POA: Insufficient documentation

## 2023-08-11 DIAGNOSIS — M79642 Pain in left hand: Secondary | ICD-10-CM

## 2023-08-11 DIAGNOSIS — H6123 Impacted cerumen, bilateral: Secondary | ICD-10-CM | POA: Diagnosis not present

## 2023-08-11 LAB — POCT URINALYSIS DIPSTICK
Bilirubin, UA: NEGATIVE
Glucose, UA: NEGATIVE
Ketones, UA: NEGATIVE
Leukocytes, UA: NEGATIVE
Nitrite, UA: NEGATIVE
Protein, UA: NEGATIVE
Spec Grav, UA: >=1.03 — AB (ref 1.010–1.025)
Urobilinogen, UA: 0.2 U/dL
pH, UA: 5.5 (ref 5.0–8.0)

## 2023-08-11 MED ORDER — NOREL AD 4-10-325 MG PO TABS: ORAL_TABLET | ORAL | 0 refills | Status: DC

## 2023-08-11 MED ORDER — MONTELUKAST SODIUM 10 MG PO TABS: 10.0000 mg | ORAL_TABLET | Freq: Every day | ORAL | 1 refills | Status: DC

## 2023-08-11 MED ORDER — AMLODIPINE BESYLATE 5 MG PO TABS: 5.0000 mg | ORAL_TABLET | Freq: Every day | ORAL | 1 refills | Status: DC

## 2023-08-11 MED ORDER — SYNTHROID 88 MCG PO TABS: ORAL_TABLET | ORAL | 2 refills | Status: DC

## 2023-08-11 NOTE — Assessment & Plan Note (Addendum)
 Squeeze test POS on right. ANA neg in the past. Advised to wear wrist sleeve while at work.  If sx persist, will consider nerve conduction study.

## 2023-08-11 NOTE — Progress Notes (Signed)
 I,Kaitlyn Terry, CMA,acting as a Neurosurgeon for Kaitlyn Aliment, MD.,have documented all relevant documentation on the behalf of Kaitlyn Aliment, MD,as directed by  Kaitlyn Aliment, MD while in the presence of Kaitlyn Aliment, MD.  Subjective:    Patient ID: Kaitlyn Terry , female    DOB: 1961-04-30 , 63 y.o.   MRN: [id number]  Chief Complaint  Patient presents with   Annual Exam   Hypertension   Hypothyroidism    HPI  She is here today for a full physical examination. She is followed by GYN, Kaitlyn Terry for her pelvic exams. She reports compliance with meds. She denies having any headaches, palpitations and shortness of breath.   Letter sent to GYN for pap result.   Hypertension This is a chronic problem. The current episode started more than 1 year ago. The problem has been gradually improving since onset. The problem is controlled. Associated symptoms include headaches. Pertinent negatives include no blurred vision. Risk factors for coronary artery disease include obesity and sedentary lifestyle. The current treatment provides moderate improvement. Compliance problems include exercise.      Past Medical History:  Diagnosis Date   Acute non-recurrent maxillary sinusitis 04/03/2022   Breast density    Hypertension    Pelvic pain    Thyroid disease    Yeast vaginitis      Family History  Problem Relation Age of Onset   COPD Mother    Asthma Mother    Cancer Father    Allergies Father      Current Outpatient Medications:    Albuterol-Budesonide (AIRSUPRA) 90-80 MCG/ACT AERO, Inhale 90 mcg into the lungs 2 (two) times daily as needed., Disp: 10 g, Rfl: 1   Azelastine-Fluticasone 137-50 MCG/ACT SUSP, Place 1 spray into the nose in the morning and at bedtime., Disp: 23 g, Rfl: 5   Chlorphen-PE-Acetaminophen (NOREL AD) 4-10-325 MG TABS, One tab po twice daily prn, Disp: 20 tablet, Rfl: 0   EPINEPHrine 0.3 mg/0.3 mL IJ SOAJ injection, Inject 0.3 mg into the muscle as needed  for anaphylaxis., Disp: 1 each, Rfl: 1   Vitamin D, Ergocalciferol, (DRISDOL) 1.25 MG (50000 UNIT) CAPS capsule, TAKE 1 CAPSULE BY ORAL ROUTE once weekly, Disp: 12 capsule, Rfl: 1   amLODipine (NORVASC) 5 MG tablet, Take 1 tablet (5 mg total) by mouth daily., Disp: 90 tablet, Rfl: 1   fluticasone (FLONASE) 50 MCG/ACT nasal spray, Place 1 spray into both nostrils daily., Disp: 15.8 g, Rfl: 1   montelukast (SINGULAIR) 10 MG tablet, Take 1 tablet (10 mg total) by mouth at bedtime., Disp: 90 tablet, Rfl: 1   SYNTHROID 88 MCG tablet, TAKE 1 TABLET BY MOUTH DAILY, Disp: 90 tablet, Rfl: 2   Allergies  Allergen Reactions   Peanuts [Peanut Oil]     Positive testing in the past. Has had vomiting.       The patient states she uses post menopausal status for birth control. No LMP recorded. Patient has had a hysterectomy.. Negative for Dysmenorrhea. Negative for: breast discharge, breast lump(s), breast pain and breast self exam. Associated symptoms include abnormal vaginal bleeding. Pertinent negatives include abnormal bleeding (hematology), anxiety, decreased libido, depression, difficulty falling sleep, dyspareunia, history of infertility, nocturia, sexual dysfunction, sleep disturbances, urinary incontinence, urinary urgency, vaginal discharge and vaginal itching. Diet regular.The patient states her exercise level is  intermittent, she plans to resume her walking routine in the near future.  Her treadmill needs maintenance.   . The patient's tobacco  use is:  Social History   Tobacco Use  Smoking Status Never  Smokeless Tobacco Never  . She has been exposed to passive smoke. The patient's alcohol use is:  Social History   Substance and Sexual Activity  Alcohol Use No    Review of Systems  Constitutional: Negative.   HENT: Negative.    Eyes: Negative.  Negative for blurred vision.  Respiratory: Negative.    Cardiovascular: Negative.   Gastrointestinal: Negative.   Endocrine: Negative.    Genitourinary: Negative.   Musculoskeletal:  Positive for arthralgias.       She complains of pain in her fingers down to her hands. Sx are most severe in R hand.  R>L, denies having any weakness. Denies change in grip strength. However, feels she sometimes drops objects.   Skin: Negative.   Allergic/Immunologic: Negative.   Neurological:  Positive for numbness and headaches.       She c/o right sided pain over her right eye.   Hematological: Negative.   Psychiatric/Behavioral: Negative.       Today's Vitals   08/11/23 1417  BP: 110/70  Pulse: 85  Temp: 98.6 F (37 C)  SpO2: 98%  Weight: 162 lb 3.2 oz (73.6 kg)  Height: 5\' 4"  (1.626 m)   Body mass index is 27.84 kg/m.  Wt Readings from Last 3 Encounters:  08/11/23 162 lb 3.2 oz (73.6 kg)  03/24/23 164 lb (74.4 kg)  01/28/23 161 lb 3.2 oz (73.1 kg)     Objective:  Physical Exam Vitals and nursing note reviewed.  Constitutional:      Appearance: Normal appearance.  HENT:     Head: Normocephalic and atraumatic.     Right Ear: Ear canal and external ear normal. There is impacted cerumen.     Left Ear: Ear canal and external ear normal. There is impacted cerumen.  Eyes:     Extraocular Movements: Extraocular movements intact.     Conjunctiva/sclera: Conjunctivae normal.     Pupils: Pupils are equal, round, and reactive to light.  Cardiovascular:     Rate and Rhythm: Normal rate and regular rhythm.     Pulses: Normal pulses.     Heart sounds: Normal heart sounds.  Pulmonary:     Effort: Pulmonary effort is normal.     Breath sounds: Normal breath sounds.  Chest:  Breasts:    Tanner Score is 5.     Right: Normal.     Left: Normal.  Abdominal:     General: Bowel sounds are normal.     Palpations: Abdomen is soft.  Genitourinary:    Comments: deferred Musculoskeletal:        General: Normal range of motion.     Cervical back: Normal range of motion and neck supple.  Skin:    General: Skin is warm and dry.   Neurological:     General: No focal deficit present.     Mental Status: She is alert and oriented to person, place, and time.  Psychiatric:        Mood and Affect: Mood normal.        Behavior: Behavior normal.         Assessment And Plan:     Encounter for general adult medical examination w/o abnormal findings Assessment & Plan: A full exam was performed.  Importance of monthly self breast exams was discussed with the patient.  She is advised to get 30-45 minutes of regular exercise, no less than four to five days  per week. Both weight-bearing and aerobic exercises are recommended.  She is advised to follow a healthy diet with at least six fruits/veggies per day, decrease intake of red meat and other saturated fats and to increase fish intake to twice weekly.  Meats/fish should not be fried -- baked, boiled or broiled is preferable. It is also important to cut back on your sugar intake.  Be sure to read labels - try to avoid anything with added sugar, high fructose corn syrup or other sweeteners.  If you must use a sweetener, you can try stevia or monkfruit.  It is also important to avoid artificially sweetened foods/beverages and diet drinks. Lastly, wear SPF 50 sunscreen on exposed skin and when in direct sunlight for an extended period of time.  Be sure to avoid fast food restaurants and aim for at least 60 ounces of water daily.      Orders: -     CBC -     CMP14+EGFR -     Lipid panel  Essential hypertension, benign Assessment & Plan: Chronic, well controlled.  EKG performed, NSR w/o acute changes.  She will continue with amlodipine 5mg  daily. She is encouraged to follow low sodium diet.  She will rto in six months.   Orders: -     POCT urinalysis dipstick -     Microalbumin / creatinine urine ratio -     EKG 12-Lead  Primary hypothyroidism Assessment & Plan: Chronic, she is currently taking Synthroid 88cmg daily. I will check thyroid panel and adjust meds as needed.    Orders: -     Synthroid; TAKE 1 TABLET BY MOUTH DAILY  Dispense: 90 tablet; Refill: 2 -     TSH + free T4  Bilateral impacted cerumen Assessment & Plan: She declines ear lavage.  She plans to use Debrox gtt at home.    Frontal sinus pain Assessment & Plan: She was given sample of Norel AD to take twice daily as needed. Rx was sent as well.   Pain in both hands Assessment & Plan: Squeeze test POS on right. ANA neg in the past. Advised to wear wrist sleeve while at work.  If sx persist, will consider nerve conduction study.    Other abnormal glucose Assessment & Plan: Previous labs reviewed, her A1c has been elevated in the past. I will check an A1c today. Reminded to avoid refined sugars including sugary drinks/foods and processed meats including bacon, sausages and deli meats.    Orders: -     Hemoglobin A1c  Other orders -     amLODIPine Besylate; Take 1 tablet (5 mg total) by mouth daily.  Dispense: 90 tablet; Refill: 1 -     Montelukast Sodium; Take 1 tablet (10 mg total) by mouth at bedtime.  Dispense: 90 tablet; Refill: 1 -     Norel AD; One tab po twice daily prn  Dispense: 20 tablet; Refill: 0     Return for 1 year physical, 6 month bp. Patient was given opportunity to ask questions. Patient verbalized understanding of the plan and was able to repeat key elements of the plan. All questions were answered to their satisfaction.    Missy Sabins, MD, have reviewed all documentation for this visit. The documentation on 08/11/23 for the exam, diagnosis, procedures, and orders are all accurate and complete.

## 2023-08-11 NOTE — Assessment & Plan Note (Signed)
 She was given sample of Norel AD to take twice daily as needed. Rx was sent as well.

## 2023-08-11 NOTE — Assessment & Plan Note (Signed)

## 2023-08-11 NOTE — Assessment & Plan Note (Signed)
 She declines ear lavage.  She plans to use Debrox gtt at home.

## 2023-08-11 NOTE — Assessment & Plan Note (Signed)
 Previous labs reviewed, her A1c has been elevated in the past. I will check an A1c today. Reminded to avoid refined sugars including sugary drinks/foods and processed meats including bacon, sausages and deli meats.

## 2023-08-11 NOTE — Assessment & Plan Note (Signed)
 Chronic, she is currently taking Synthroid 88cmg daily. I will check thyroid panel and adjust meds as needed.

## 2023-08-11 NOTE — Assessment & Plan Note (Signed)
 Chronic, well controlled.  EKG performed, NSR w/o acute changes.  She will continue with amlodipine 5mg  daily. She is encouraged to follow low sodium diet.  She will rto in six months.

## 2023-08-11 NOTE — Patient Instructions (Signed)

## 2023-08-12 LAB — TSH+FREE T4
Free T4: 1.47 ng/dL (ref 0.82–1.77)
TSH: 0.072 u[IU]/mL — ABNORMAL LOW (ref 0.450–4.500)

## 2023-08-12 LAB — LIPID PANEL
Chol/HDL Ratio: 5.5 ratio — ABNORMAL HIGH (ref 0.0–4.4)
Cholesterol, Total: 266 mg/dL — ABNORMAL HIGH (ref 100–199)
HDL: 48 mg/dL (ref >39)
LDL Chol Calc (NIH): 193 mg/dL — ABNORMAL HIGH (ref 0–99)
Triglycerides: 138 mg/dL (ref 0–149)
VLDL Cholesterol Cal: 25 mg/dL (ref 5–40)

## 2023-08-12 LAB — CMP14+EGFR
ALT: 20 IU/L (ref 0–32)
AST: 18 IU/L (ref 0–40)
Albumin: 4.4 g/dL (ref 3.9–4.9)
Alkaline Phosphatase: 123 IU/L — ABNORMAL HIGH (ref 44–121)
BUN/Creatinine Ratio: 16 (ref 12–28)
BUN: 13 mg/dL (ref 8–27)
Bilirubin Total: 0.3 mg/dL (ref 0.0–1.2)
CO2: 25 mmol/L (ref 20–29)
Calcium: 9.8 mg/dL (ref 8.7–10.3)
Chloride: 99 mmol/L (ref 96–106)
Creatinine, Ser: 0.83 mg/dL (ref 0.57–1.00)
Globulin, Total: 2.8 g/dL (ref 1.5–4.5)
Glucose: 79 mg/dL (ref 70–99)
Potassium: 3.8 mmol/L (ref 3.5–5.2)
Sodium: 144 mmol/L (ref 134–144)
Total Protein: 7.2 g/dL (ref 6.0–8.5)
eGFR: 80 mL/min/{1.73_m2} (ref >59)

## 2023-08-12 LAB — CBC
Hematocrit: 43.1 % (ref 34.0–46.6)
Hemoglobin: 14.1 g/dL (ref 11.1–15.9)
MCH: 28.7 pg (ref 26.6–33.0)
MCHC: 32.7 g/dL (ref 31.5–35.7)
MCV: 88 fL (ref 79–97)
Platelets: 332 10*3/uL (ref 150–450)
RBC: 4.91 x10E6/uL (ref 3.77–5.28)
RDW: 13.1 % (ref 11.7–15.4)
WBC: 7.2 10*3/uL (ref 3.4–10.8)

## 2023-08-12 LAB — HEMOGLOBIN A1C
Est. average glucose Bld gHb Est-mCnc: 128 mg/dL
Hgb A1c MFr Bld: 6.1 % — ABNORMAL HIGH (ref 4.8–5.6)

## 2023-08-12 LAB — MICROALBUMIN / CREATININE URINE RATIO
Creatinine, Urine: 149.1 mg/dL
Microalb/Creat Ratio: 5 mg/g{creat} (ref 0–29)
Microalbumin, Urine: 7.8 ug/mL

## 2023-08-19 LAB — ANTINUCLEAR ANTIBODIES, IFA: ANA Titer 1: NEGATIVE

## 2023-08-19 LAB — CYCLIC CITRUL PEPTIDE ANTIBODY, IGG/IGA: Cyclic Citrullin Peptide Ab: 7 U (ref 0–19)

## 2023-08-19 LAB — SPECIMEN STATUS REPORT

## 2023-09-21 ENCOUNTER — Encounter: Payer: Self-pay | Admitting: Internal Medicine

## 2023-09-21 ENCOUNTER — Ambulatory Visit: Admitting: Internal Medicine

## 2023-09-21 VITALS — BP 118/78 | HR 78 | Temp 98.3°F | Ht 64.0 in | Wt 163.2 lb

## 2023-09-21 DIAGNOSIS — E559 Vitamin D deficiency, unspecified: Secondary | ICD-10-CM | POA: Diagnosis not present

## 2023-09-21 DIAGNOSIS — M545 Low back pain, unspecified: Secondary | ICD-10-CM | POA: Insufficient documentation

## 2023-09-21 DIAGNOSIS — M79641 Pain in right hand: Secondary | ICD-10-CM

## 2023-09-21 DIAGNOSIS — M79642 Pain in left hand: Secondary | ICD-10-CM | POA: Diagnosis not present

## 2023-09-21 LAB — POCT URINALYSIS DIPSTICK
Bilirubin, UA: NEGATIVE
Blood, UA: NEGATIVE
Glucose, UA: NEGATIVE
Ketones, UA: NEGATIVE
Leukocytes, UA: NEGATIVE
Nitrite, UA: NEGATIVE
Protein, UA: NEGATIVE
Spec Grav, UA: 1.01 (ref 1.010–1.025)
Urobilinogen, UA: 0.2 U/dL
pH, UA: 5.5 (ref 5.0–8.0)

## 2023-09-21 MED ORDER — CYCLOBENZAPRINE HCL 10 MG PO TABS: ORAL_TABLET | ORAL | 0 refills | Status: AC

## 2023-09-21 NOTE — Progress Notes (Signed)
 I,Kaitlyn Terry, CMA,acting as a Neurosurgeon for Kaitlyn Dung, MD.,have documented all relevant documentation on the behalf of Kaitlyn Dung, MD,as directed by  Kaitlyn Dung, MD while in the presence of Kaitlyn Dung, MD.  Subjective:  Patient ID: Kaitlyn Terry , female    DOB: 1960-11-02 , 63 y.o.   MRN: [id number]  Chief Complaint  Patient presents with   Back Pain    Patient presents today for lower back pain on the left side. This initially started a week ago. This pain radiates to the front of her pelvic area.  She also complains of right hand pain. This has been an ongoing issue for a while.  She reports not currently being established with an orthopedic.     HPI Discussed the use of AI scribe software for clinical note transcription with the patient, who gave verbal consent to proceed.  History of Present Illness Kaitlyn Terry is a 63 year old female who presents with lower back pain and abdominal discomfort.  She experiences lower back pain that sometimes radiates to the side and lower abdomen. The pain began earlier today and was also present yesterday. It is not relieved by bowel movements. She has taken ibuprofen, which provides some relief. The pain does not seem to be related to any specific activity and feels like it 'came out of nowhere.' She suspects her sleeping position on an adjustable bed might be contributing to the pain. She is concerned about possible kidney involvement due to the pain's location, but denies urinary symptoms and her urine has been checked and is fine. No tingling in her legs or hands, but she reports a sharp shooting pain down her pinky finger.  Also reports decreased grip strength in the right hand.   She reports increased joint pain, particularly in the fingers. She recalls having similar pains in her fingers during a previous visit in March, where screening tests for autoimmune issues and rheumatoid arthritis were negative. She has a history  of low vitamin D levels, which were checked last year and found to be 'terribly low.' She is currently taking vitamin D supplements.    Past Medical History:  Diagnosis Date   Acute non-recurrent maxillary sinusitis 04/03/2022   Breast density    Hypertension    Pelvic pain    Thyroid disease    Yeast vaginitis      Family History  Problem Relation Age of Onset   COPD Mother    Asthma Mother    Cancer Father    Allergies Father      Current Outpatient Medications:    Albuterol-Budesonide (AIRSUPRA) 90-80 MCG/ACT AERO, Inhale 90 mcg into the lungs 2 (two) times daily as needed., Disp: 10 g, Rfl: 1   amLODipine (NORVASC) 5 MG tablet, Take 1 tablet (5 mg total) by mouth daily., Disp: 90 tablet, Rfl: 1   Azelastine-Fluticasone 137-50 MCG/ACT SUSP, Place 1 spray into the nose in the morning and at bedtime., Disp: 23 g, Rfl: 5   Chlorphen-PE-Acetaminophen (NOREL AD) 4-10-325 MG TABS, One tab po twice daily prn, Disp: 20 tablet, Rfl: 0   cyclobenzaprine (FLEXERIL) 10 MG tablet, One tab po qpm prn, Disp: 30 tablet, Rfl: 0   EPINEPHrine 0.3 mg/0.3 mL IJ SOAJ injection, Inject 0.3 mg into the muscle as needed for anaphylaxis., Disp: 1 each, Rfl: 1   montelukast (SINGULAIR) 10 MG tablet, Take 1 tablet (10 mg total) by mouth at bedtime., Disp: 90 tablet, Rfl: 1  SYNTHROID 88 MCG tablet, TAKE 1 TABLET BY MOUTH DAILY, Disp: 90 tablet, Rfl: 2   Vitamin D, Ergocalciferol, (DRISDOL) 1.25 MG (50000 UNIT) CAPS capsule, TAKE 1 CAPSULE BY ORAL ROUTE once weekly, Disp: 12 capsule, Rfl: 1   fluticasone (FLONASE) 50 MCG/ACT nasal spray, Place 1 spray into both nostrils daily., Disp: 15.8 g, Rfl: 1   Allergies  Allergen Reactions   Peanuts [Peanut Oil]     Positive testing in the past. Has had vomiting.      Review of Systems  Constitutional: Negative.   Respiratory: Negative.    Cardiovascular: Negative.   Gastrointestinal: Negative.   Musculoskeletal:  Positive for arthralgias and back pain.   Neurological: Negative.   Psychiatric/Behavioral: Negative.       Today's Vitals   09/21/23 1541  BP: 118/78  Pulse: 78  Temp: 98.3 F (36.8 C)  SpO2: 98%  Weight: 163 lb 3.2 oz (74 kg)  Height: 5\' 4"  (1.626 m)   Body mass index is 28.01 kg/m.  Wt Readings from Last 3 Encounters:  09/21/23 163 lb 3.2 oz (74 kg)  08/11/23 162 lb 3.2 oz (73.6 kg)  03/24/23 164 lb (74.4 kg)     Objective:  Physical Exam Vitals and nursing note reviewed.  Constitutional:      Appearance: Normal appearance.  HENT:     Head: Normocephalic and atraumatic.  Eyes:     Extraocular Movements: Extraocular movements intact.  Cardiovascular:     Rate and Rhythm: Normal rate and regular rhythm.     Heart sounds: Normal heart sounds.  Pulmonary:     Effort: Pulmonary effort is normal.     Breath sounds: Normal breath sounds.  Musculoskeletal:        General: Tenderness present. No deformity.     Cervical back: Normal range of motion.  Skin:    General: Skin is warm.  Neurological:     General: No focal deficit present.     Mental Status: She is alert.  Psychiatric:        Mood and Affect: Mood normal.        Behavior: Behavior normal.        Assessment And Plan:  Acute left-sided low back pain without sciatica Assessment & Plan: Lower back pain radiating to side and lower abdomen, likely due to muscle tightness from prolonged sitting and possible hip misalignment. No renal involvement. - Prescribe muscle relaxant at night, starting with half a tablet. - Recommend regular lower back stretching exercises. - Suggest consulting a chiropractor for hip realignment before traveling. - Consider x-rays at orthopedic clinic if pain persists.  Orders: -     POCT urinalysis dipstick  Pain in both hands Assessment & Plan: Increased joint pain, particularly in fingers. Suspected osteoarthritis. Previous negative tests for autoimmune disorders and rheumatoid arthritis. Plan to reassess vitamin D  levels. -ANA and anti-CCP previously negative.  -Check labs as below  Orders: -     Sedimentation rate -     Uric acid -     Rheumatoid factor -     Ambulatory referral to Hand Surgery  Vitamin D deficiency disease Assessment & Plan: I WILL CHECK A VIT D LEVEL AND SUPPLEMENT AS NEEDED.  ALSO ENCOURAGED TO SPEND 15 MINUTES IN THE SUN DAILY.   Orders: -     VITAMIN D 25 Hydroxy (Vit-D Deficiency, Fractures)  Other orders -     Cyclobenzaprine HCl; One tab po qpm prn  Dispense: 30 tablet; Refill: 0  Return if symptoms worsen or fail to improve.  Patient was given opportunity to ask questions. Patient verbalized understanding of the plan and was able to repeat key elements of the plan. All questions were answered to their satisfaction.   I, Kaitlyn Dung, MD, have reviewed all documentation for this visit. The documentation on 09/21/23 for the exam, diagnosis, procedures, and orders are all accurate and complete.   IF YOU HAVE BEEN REFERRED TO A SPECIALIST, IT MAY TAKE 1-2 WEEKS TO SCHEDULE/PROCESS THE REFERRAL. IF YOU HAVE NOT HEARD FROM US /SPECIALIST IN TWO WEEKS, PLEASE GIVE US  A CALL AT [phone].   THE PATIENT IS ENCOURAGED TO PRACTICE SOCIAL DISTANCING DUE TO THE COVID-19 PANDEMIC.

## 2023-09-21 NOTE — Patient Instructions (Signed)
 Acute Back Pain, Adult Acute back pain is sudden and usually short-lived. It is often caused by an injury to the muscles and tissues in the back. The injury may result from: A muscle, tendon, or ligament getting overstretched or torn. Ligaments are tissues that connect bones to each other. Lifting something improperly can cause a back strain. Wear and tear (degeneration) of the spinal disks. Spinal disks are circular tissue that provide cushioning between the bones of the spine (vertebrae). Twisting motions, such as while playing sports or doing yard work. A hit to the back. Arthritis. You may have a physical exam, lab tests, and imaging tests to find the cause of your pain. Acute back pain usually goes away with rest and home care. Follow these instructions at home: Managing pain, stiffness, and swelling Take over-the-counter and prescription medicines only as told by your health care provider. Treatment may include medicines for pain and inflammation that are taken by mouth or applied to the skin, or muscle relaxants. Your health care provider may recommend applying ice during the first 24-48 hours after your pain starts. To do this: Put ice in a plastic bag. Place a towel between your skin and the bag. Leave the ice on for 20 minutes, 2-3 times a day. Remove the ice if your skin turns bright red. This is very important. If you cannot feel pain, heat, or cold, you have a greater risk of damage to the area. If directed, apply heat to the affected area as often as told by your health care provider. Use the heat source that your health care provider recommends, such as a moist heat pack or a heating pad. Place a towel between your skin and the heat source. Leave the heat on for 20-30 minutes. Remove the heat if your skin turns bright red. This is especially important if you are unable to feel pain, heat, or cold. You have a greater risk of getting burned. Activity  Do not stay in bed. Staying in  bed for more than 1-2 days can delay your recovery. Sit up and stand up straight. Avoid leaning forward when you sit or hunching over when you stand. If you work at a desk, sit close to it so you do not need to lean over. Keep your chin tucked in. Keep your neck drawn back, and keep your elbows bent at a 90-degree angle (right angle). Sit high and close to the steering wheel when you drive. Add lower back (lumbar) support to your car seat, if needed. Take short walks on even surfaces as soon as you are able. Try to increase the length of time you walk each day. Do not sit, drive, or stand in one place for more than 30 minutes at a time. Sitting or standing for long periods of time can put stress on your back. Do not drive or use heavy machinery while taking prescription pain medicine. Use proper lifting techniques. When you bend and lift, use positions that put less stress on your back: [place] your knees. Keep the load close to your body. Avoid twisting. Exercise regularly as told by your health care provider. Exercising helps your back heal faster and helps prevent back injuries by keeping muscles strong and flexible. Work with a physical therapist to make a safe exercise program, as recommended by your health care provider. Do any exercises as told by your physical therapist. Lifestyle Maintain a healthy weight. Extra weight puts stress on your back and makes it difficult to have good  posture. Avoid activities or situations that make you feel anxious or stressed. Stress and anxiety increase muscle tension and can make back pain worse. Learn ways to manage anxiety and stress, such as through exercise. General instructions Sleep on a firm mattress in a comfortable position. Try lying on your side with your knees slightly bent. If you lie on your back, put a pillow under your knees. Keep your head and neck in a straight line with your spine (neutral position) when using electronic equipment like  smartphones or pads. To do this: Raise your smartphone or pad to look at it instead of bending your head or neck to look down. Put the smartphone or pad at the level of your face while looking at the screen. Follow your treatment plan as told by your health care provider. This may include: Cognitive or behavioral therapy. Acupuncture or massage therapy. Meditation or yoga. Contact a health care provider if: You have pain that is not relieved with rest or medicine. You have increasing pain going down into your legs or buttocks. Your pain does not improve after 2 weeks. You have pain at night. You lose weight without trying. You have a fever or chills. You develop nausea or vomiting. You develop abdominal pain. Get help right away if: You develop new bowel or bladder control problems. You have unusual weakness or numbness in your arms or legs. You feel faint. These symptoms may represent a serious problem that is an emergency. Do not wait to see if the symptoms will go away. Get medical help right away. Call your local emergency services (911 in the [place] Do not drive yourself to the hospital. Summary Acute back pain is sudden and usually short-lived. Use proper lifting techniques. When you bend and lift, use positions that put less stress on your back. Take over-the-counter and prescription medicines only as told by your health care provider, and apply heat or ice as told. This information is not intended to replace advice given to you by your health care provider. Make sure you discuss any questions you have with your health care provider. Document Revised: 08/17/2020 Document Reviewed: 08/17/2020 Elsevier Patient Education  2024 [employer].

## 2023-09-21 NOTE — Assessment & Plan Note (Signed)
 I WILL CHECK A VIT D LEVEL AND SUPPLEMENT AS NEEDED.  ALSO ENCOURAGED TO SPEND 15 MINUTES IN THE SUN DAILY.

## 2023-09-21 NOTE — Assessment & Plan Note (Signed)
 Lower back pain radiating to side and lower abdomen, likely due to muscle tightness from prolonged sitting and possible hip misalignment. No renal involvement. - Prescribe muscle relaxant at night, starting with half a tablet. - Recommend regular lower back stretching exercises. - Suggest consulting a chiropractor for hip realignment before traveling. - Consider x-rays at orthopedic clinic if pain persists.

## 2023-09-21 NOTE — Assessment & Plan Note (Addendum)
 Increased joint pain, particularly in fingers. Suspected osteoarthritis. Previous negative tests for autoimmune disorders and rheumatoid arthritis. Plan to reassess vitamin D levels. -ANA and anti-CCP previously negative.  -Check labs as below

## 2023-09-22 ENCOUNTER — Encounter: Payer: Self-pay | Admitting: Internal Medicine

## 2023-09-22 LAB — URIC ACID: Uric Acid: 4.9 mg/dL (ref 3.0–7.2)

## 2023-09-22 LAB — RHEUMATOID FACTOR: Rheumatoid fact SerPl-aCnc: <10 [IU]/mL

## 2023-09-22 LAB — SEDIMENTATION RATE: Sed Rate: 22 mm/h (ref 0–40)

## 2023-09-22 LAB — VITAMIN D 25 HYDROXY (VIT D DEFICIENCY, FRACTURES): Vit D, 25-Hydroxy: 48.1 ng/mL (ref 30.0–100.0)

## 2023-10-13 ENCOUNTER — Ambulatory Visit: Payer: BC Managed Care – PPO | Admitting: Internal Medicine

## 2023-10-26 ENCOUNTER — Encounter: Payer: Self-pay | Admitting: Internal Medicine

## 2023-10-26 ENCOUNTER — Ambulatory Visit: Admitting: Internal Medicine

## 2023-10-26 VITALS — BP 124/78 | HR 82 | Temp 97.0°F | Wt 160.9 lb

## 2023-10-26 DIAGNOSIS — T7800XD Anaphylactic reaction due to unspecified food, subsequent encounter: Secondary | ICD-10-CM

## 2023-10-26 DIAGNOSIS — J3089 Other allergic rhinitis: Secondary | ICD-10-CM | POA: Diagnosis not present

## 2023-10-26 DIAGNOSIS — B999 Unspecified infectious disease: Secondary | ICD-10-CM | POA: Diagnosis not present

## 2023-10-26 DIAGNOSIS — J4520 Mild intermittent asthma, uncomplicated: Secondary | ICD-10-CM | POA: Diagnosis not present

## 2023-10-26 DIAGNOSIS — T7800XA Anaphylactic reaction due to unspecified food, initial encounter: Secondary | ICD-10-CM

## 2023-10-26 DIAGNOSIS — J302 Other seasonal allergic rhinitis: Secondary | ICD-10-CM

## 2023-10-26 NOTE — Progress Notes (Signed)
 FOLLOW UP Date of Service/Encounter:  10/26/23  Subjective:  Kaitlyn Terry (DOB: 1960-06-23) is a 63 y.o. female who [facility] in re-evaluation of the following: Allergic rhinitis, nut allergy , reactive airway disease History obtained from: chart review and patient.  For Review, LV was on 03/28/2024 with Dr. Jolayne Natter seen for skin test visit. See below for summary of history and diagnostics.  ----------------------------------------------------- Pertinent History/Diagnostics:  Reactive Airway Disease: Use of Airsupra  for cough. Improvement noted with use; triggered by URI  - no recurrent symptoms  Allergic Rhinitis:  Frequent sinus infections requiring antibiotics and steroids. Persistent symptoms including runny nose, headaches, facial pain, and nasal congestion. Symptoms worsen with dust exposure and temperature changes. Previous allergy  shots in 2007-2008. - SPT environmental panel (04/02/23): grass, weeds, trees, mold, dust mite, dog, mixed feathers, roach Food Allergy :  Reports of vomiting after ingestion of nuts and aerosolized symptoms with exposure to peanut oil. Avoidance of all nuts. - SPT select foods 04/01/23: Positive to peanut, pecan, hazelnut ---------------------------- Today presents for follow-up. Discussed the use of AI scribe software for clinical note transcription with the patient, who gave verbal consent to proceed.  History of Present Illness Kaitlyn Terry is a 63 year old female with seasonal allergies who presents with frequent sinus infections.  She experiences frequent sinus infections and has been on antibiotics twice since her last visit in October. Symptoms include sneezing and rhinorrhea, particularly when pollen levels are high. She uses dymista  when feeling congested, which she finds helpful. She does not regularly use Singulair  or any other daily allergy  medications due to a dislike of taking medications.  Occasionally, she takes Benadryl when symptoms are severe.  She experiences heaviness in her chest when pollen levels are high but does not use an inhaler or experience frequent cough, wheeze, or shortness of breath.  She had a recent allergic reaction after consuming a [place] sandwich from [employer], which contained peanut products. During this reaction, she felt hot and itchy but managed it with Benadryl. She carries an EpiPen  due to her known peanut allergy .  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   All medications reviewed by clinical staff and updated in chart. No new pertinent medical or surgical history except as noted in HPI.  ROS: All others negative except as noted per HPI.   Objective:  BP 124/78 (BP Location: Left Arm, Patient Position: Sitting)   Pulse 82   Temp (!) 97 F (36.1 C) (Temporal)   Wt 160 lb 14.4 oz (73 kg)   SpO2 96%   BMI 27.62 kg/m  Body mass index is 27.62 kg/m. Physical Exam: General Appearance:  Alert, cooperative, no distress, appears stated age  Head:  Normocephalic, without obvious abnormality, atraumatic  Eyes:  Conjunctiva clear, EOM's intact  Ears EACs normal bilaterally  Nose: Nares normal, erythematous nasal mucosa, no visible anterior polyps, and septum midline  Throat: Lips, tongue normal; teeth and gums normal, normal posterior oropharynx  Neck: Supple, symmetrical  Lungs:   clear to auscultation bilaterally, Respirations unlabored, no coughing  Heart:  regular rate and rhythm and no murmur, Appears well perfused  Extremities: No edema  Skin: Skin color, texture, turgor normal and no rashes or lesions on visualized portions of skin  Neurologic: No gross deficits   Labs:  Lab Orders  No laboratory test(s) ordered today  Assessment/Plan  Seasonal and perennial allergic rhinitis  Recurrent infections  Allergy  with  anaphylaxis due to food  Mild intermittent reactive airway disease without complication   Patient Instructions  Recurrent Sinus Infections  Allergic Rhinitis    Allergy  test 04/01/23: Positive to grass, weeds, trees, mold, dust mite, dog, mixed feathers, roach  Plan:  -Continue Dymista  nasal spray: use 1-2 sprays in each nostril daily  -Consider daily antihistamine such as Zyrtec 10 mg, Allegra 180 mg, Claritin 10 mg daily as needed    Avoid benadryl  - Continue avoidance measures   Start allergy  injections. Let us  know if you want to do RUSH vs STANDARD build up  Indication: desire to reduce lifetime use of medication, inadequate control  We [facility] have your [facility]  Had a detailed discussion with patient/family that clinical history is suggestive of allergic rhinitis, and may benefit from allergy  immunotherapy (AIT). Discussed in detail regarding the dosing, schedule, side effects (mild to moderate local allergic reaction and rarely systemic allergic reactions including anaphylaxis/death), alternatives and benefits (significant improvement in nasal symptoms, seasonal flares of asthma) of immunotherapy with the patient. There is significant time commitment involved with allergy  shots, which includes weekly immunotherapy injections for first 9-12 months and then biweekly to monthly injections for 3-5 years. Clinical response is often delayed and patient may not see an improvement for 6-12 months. Consent was signed. I have prescribed epinephrine  injectable and demonstrated proper use. For mild symptoms you can take over the counter antihistamines such as Benadryl and monitor symptoms closely. If symptoms worsen or if you have severe symptoms including breathing issues, throat closure, significant swelling, whole body hives, severe diarrhea and vomiting, lightheadedness then inject epinephrine  and seek immediate medical care afterwards. Action plan given.   Nut  Allergy  -Allergy  test 04/01/23: Positive to peanut, pecan, hazelnut -Continue avoidance of peanuts and tree nuts -EpiPen  and food allergy  action plan provided today  Reactive Airway Disease: mild intermittent Use of Airsupra  for cough. Improvement noted with use; triggered by URI  -Continue to air supra 2 puffs every 4 hours as needed for cough, wheeze, dyspnea  -Do not use more than 12 puffs in 24 hours  - Rinse mouth after use   Follow up:  Let us  know if you would like to start allergy  injections via RUSH vs STANDARD   Follow up: with me 6 months after starting allergy  injections   Thank you so much for letting me partake in your care today.  Don't hesitate to reach out if you have any additional concerns!  Orelia Binet, MD  [facility] [place], [place]   Other:    Thank you so much for letting me partake in your care today.  Don't hesitate to reach out if you have any additional concerns!  Orelia Binet, MD  [facility] [place], [place]

## 2023-10-26 NOTE — Patient Instructions (Addendum)
 Recurrent Sinus Infections  Allergic Rhinitis    Allergy  test 04/01/23: Positive to grass, weeds, trees, mold, dust mite, dog, mixed feathers, roach  Plan:  -Continue Dymista  nasal spray: use 1-2 sprays in each nostril daily  -Consider daily antihistamine such as Zyrtec 10 mg, Allegra 180 mg, Claritin 10 mg daily as needed    Avoid benadryl  - Continue avoidance measures   Start allergy  injections. Let us  know if you want to do RUSH vs STANDARD build up  Indication: desire to reduce lifetime use of medication, inadequate control  We can have your injections sent to [facility]  Had a detailed discussion with patient/family that clinical history is suggestive of allergic rhinitis, and may benefit from allergy  immunotherapy (AIT). Discussed in detail regarding the dosing, schedule, side effects (mild to moderate local allergic reaction and rarely systemic allergic reactions including anaphylaxis/death), alternatives and benefits (significant improvement in nasal symptoms, seasonal flares of asthma) of immunotherapy with the patient. There is significant time commitment involved with allergy  shots, which includes weekly immunotherapy injections for first 9-12 months and then biweekly to monthly injections for 3-5 years. Clinical response is often delayed and patient may not see an improvement for 6-12 months. Consent was signed. I have prescribed epinephrine  injectable and demonstrated proper use. For mild symptoms you can take over the counter antihistamines such as Benadryl and monitor symptoms closely. If symptoms worsen or if you have severe symptoms including breathing issues, throat closure, significant swelling, whole body hives, severe diarrhea and vomiting, lightheadedness then inject epinephrine  and seek immediate medical care afterwards. Action plan given.   Nut Allergy  -Allergy  test 04/01/23: Positive to peanut, pecan, hazelnut -Continue avoidance of peanuts and tree nuts -EpiPen   and food allergy  action plan provided today  Reactive Airway Disease: mild intermittent Use of Airsupra  for cough. Improvement noted with use; triggered by URI  -Continue to air supra 2 puffs every 4 hours as needed for cough, wheeze, dyspnea  -Do not use more than 12 puffs in 24 hours  - Rinse mouth after use   Follow up:  Let us  know if you would like to start allergy  injections via RUSH vs STANDARD   Follow up: with me 6 months after starting allergy  injections   Thank you so much for letting me partake in your care today.  Don't hesitate to reach out if you have any additional concerns!  Orelia Binet, MD  [facility] [place], [place]

## 2023-11-09 ENCOUNTER — Other Ambulatory Visit: Payer: Self-pay | Admitting: Internal Medicine

## 2023-11-09 DIAGNOSIS — J302 Other seasonal allergic rhinitis: Secondary | ICD-10-CM

## 2023-11-09 DIAGNOSIS — J3081 Allergic rhinitis due to animal (cat) (dog) hair and dander: Secondary | ICD-10-CM | POA: Diagnosis not present

## 2023-11-09 NOTE — Progress Notes (Signed)
 VIALS MADE 11-09-23

## 2023-11-09 NOTE — Progress Notes (Signed)
 Aeroallergen Immunotherapy   Ordering Provider: Dr. Orelia Binet   Patient Details  Name: Kaitlyn Terry  MRN: [id number]  Date of Birth: [birth year]   Order 1 of 2   Vial Label: G-W-T-D   0.3 ml (Volume)  BAU Concentration -- 7 Grass Mix* 100,000 ([place]  Blue, [place], [place], Perennial [place], RedTop, Sweet Vernal, Timothy)  0.2 ml (Volume)  1:20 Concentration -- [place]  0.3 ml (Volume)  BAU Concentration -- [place] 10,000  0.2 ml (Volume)  1:20 Concentration -- [place]  0.3 ml (Volume)  1:20 Concentration -- Ragweed Mix  0.5 ml (Volume)  1:20 Concentration -- Weed Mix*  0.2 ml (Volume)  1:10 Concentration -- Hickory*  0.2 ml (Volume)  1:10 Concentration -- Pecan Pollen  0.2 ml (Volume)  1:10 Concentration -- Pine Mix  0.5 ml (Volume)  1:10 Concentration -- Dog Epithelia    2.9  ml Extract Subtotal  2.1  ml Diluent  5.0  ml Maintenance Total   Schedule:   Blue Vial (1:100,000): Schedule B (6 doses)  Yellow Vial (1:10,000): Schedule B (6 doses)  Green Vial (1:1,000): Schedule B (6 doses)  Red Vial (1:100): Schedule A (14 doses)   Special Instructions: After completion of the first Red Vial, please space to every two weeks. After completion of the second Red Vial, please space to every 4 weeks. Ok to up dose new vials at 0.74mL --> 0.3 mL --> 0.5 mL. Ok to come twice weekly, if desired, as long as there is 48 hours between injections.

## 2023-11-09 NOTE — Addendum Note (Signed)
 Addended by: Matalie Romberger M on: 11/09/2023 12:44 PM   Modules accepted: Orders

## 2023-11-09 NOTE — Progress Notes (Signed)
 Aeroallergen Immunotherapy  Ordering Provider: Dr. Orelia Binet  Patient Details Name: Kaitlyn Terry MRN: [id number] Date of Birth: [birth year]  Order 2 of 2  Vial Label: M-DM-R  0.2 ml (Volume)  1:20 Concentration -- Alternaria alternata 0.2 ml (Volume)  1:10 Concentration -- Aspergillus mix 0.2 ml (Volume)  1:10 Concentration -- Penicillium mix 0.2 ml (Volume)  1:20 Concentration -- Bipolaris sorokiniana 0.2 ml (Volume)  1:10 Concentration -- Mucor plumbeus 0.2 ml (Volume)  1:40 Concentration -- Epicoccum nigrum 0.3 ml (Volume)  1:20 Concentration -- Cockroach, [place] 0.5 ml (Volume)   AU Concentration -- Mite Mix (DF 5,000 & DP 5,000)   2.0  ml Extract Subtotal 3.0  ml Diluent 5.0  ml Maintenance Total  Schedule:   Blue Vial (1:100,000): Schedule B (6 doses) Yellow Vial (1:10,000): Schedule B (6 doses) Green Vial (1:1,000): Schedule B (6 doses) Red Vial (1:100): Schedule A (14 doses)  Special Instructions: After completion of the first Red Vial, please space to every two weeks. After completion of the second Red Vial, please space to every 4 weeks. Ok to up dose new vials at 0.59mL --> 0.3 mL --> 0.5 mL. Ok to come twice weekly, if desired, as long as there is 48 hours between injections.

## 2023-11-10 DIAGNOSIS — J3089 Other allergic rhinitis: Secondary | ICD-10-CM | POA: Diagnosis not present

## 2023-12-04 HISTORY — PX: ULNAR NERVE DECOMPRESSION: SHX7404

## 2023-12-25 ENCOUNTER — Ambulatory Visit (INDEPENDENT_AMBULATORY_CARE_PROVIDER_SITE_OTHER)

## 2023-12-25 DIAGNOSIS — J302 Other seasonal allergic rhinitis: Secondary | ICD-10-CM | POA: Diagnosis not present

## 2023-12-25 DIAGNOSIS — J3089 Other allergic rhinitis: Secondary | ICD-10-CM | POA: Diagnosis not present

## 2023-12-30 ENCOUNTER — Ambulatory Visit (INDEPENDENT_AMBULATORY_CARE_PROVIDER_SITE_OTHER): Payer: Self-pay

## 2023-12-30 DIAGNOSIS — J309 Allergic rhinitis, unspecified: Secondary | ICD-10-CM | POA: Diagnosis not present

## 2024-01-05 ENCOUNTER — Ambulatory Visit (INDEPENDENT_AMBULATORY_CARE_PROVIDER_SITE_OTHER): Payer: Self-pay

## 2024-01-05 DIAGNOSIS — J309 Allergic rhinitis, unspecified: Secondary | ICD-10-CM | POA: Diagnosis not present

## 2024-01-14 ENCOUNTER — Ambulatory Visit (INDEPENDENT_AMBULATORY_CARE_PROVIDER_SITE_OTHER): Payer: Self-pay

## 2024-01-14 DIAGNOSIS — J309 Allergic rhinitis, unspecified: Secondary | ICD-10-CM

## 2024-01-20 ENCOUNTER — Ambulatory Visit (INDEPENDENT_AMBULATORY_CARE_PROVIDER_SITE_OTHER): Payer: Self-pay

## 2024-01-20 DIAGNOSIS — J309 Allergic rhinitis, unspecified: Secondary | ICD-10-CM

## 2024-01-29 ENCOUNTER — Ambulatory Visit (INDEPENDENT_AMBULATORY_CARE_PROVIDER_SITE_OTHER)

## 2024-01-29 DIAGNOSIS — J309 Allergic rhinitis, unspecified: Secondary | ICD-10-CM

## 2024-02-02 ENCOUNTER — Ambulatory Visit (INDEPENDENT_AMBULATORY_CARE_PROVIDER_SITE_OTHER): Payer: Self-pay

## 2024-02-02 DIAGNOSIS — J309 Allergic rhinitis, unspecified: Secondary | ICD-10-CM

## 2024-02-10 ENCOUNTER — Ambulatory Visit (INDEPENDENT_AMBULATORY_CARE_PROVIDER_SITE_OTHER): Payer: Self-pay

## 2024-02-10 DIAGNOSIS — J309 Allergic rhinitis, unspecified: Secondary | ICD-10-CM

## 2024-02-11 ENCOUNTER — Ambulatory Visit: Admitting: Internal Medicine

## 2024-02-11 ENCOUNTER — Encounter: Payer: Self-pay | Admitting: Internal Medicine

## 2024-02-11 VITALS — BP 112/78 | HR 68 | Temp 98.4°F | Ht 64.0 in | Wt 163.8 lb

## 2024-02-11 DIAGNOSIS — E039 Hypothyroidism, unspecified: Secondary | ICD-10-CM

## 2024-02-11 DIAGNOSIS — R7309 Other abnormal glucose: Secondary | ICD-10-CM

## 2024-02-11 DIAGNOSIS — J301 Allergic rhinitis due to pollen: Secondary | ICD-10-CM

## 2024-02-11 DIAGNOSIS — I10 Essential (primary) hypertension: Secondary | ICD-10-CM | POA: Diagnosis not present

## 2024-02-11 DIAGNOSIS — G5621 Lesion of ulnar nerve, right upper limb: Secondary | ICD-10-CM

## 2024-02-11 DIAGNOSIS — E7800 Pure hypercholesterolemia, unspecified: Secondary | ICD-10-CM

## 2024-02-11 DIAGNOSIS — Z860100 Personal history of colon polyps, unspecified: Secondary | ICD-10-CM | POA: Insufficient documentation

## 2024-02-11 DIAGNOSIS — H6121 Impacted cerumen, right ear: Secondary | ICD-10-CM

## 2024-02-11 MED ORDER — NOREL AD 4-10-325 MG PO TABS: ORAL_TABLET | ORAL | 0 refills | Status: AC

## 2024-02-11 MED ORDER — MONTELUKAST SODIUM 10 MG PO TABS: 10.0000 mg | ORAL_TABLET | Freq: Every day | ORAL | 1 refills | Status: AC

## 2024-02-11 MED ORDER — AMLODIPINE BESYLATE 5 MG PO TABS: 5.0000 mg | ORAL_TABLET | Freq: Every day | ORAL | 1 refills | Status: AC

## 2024-02-11 NOTE — Progress Notes (Signed)
 I,Victoria T Emmitt, CMA,acting as a Neurosurgeon for Catheryn LOISE Slocumb, MD.,have documented all relevant documentation on the behalf of Catheryn LOISE Slocumb, MD,as directed by  Catheryn LOISE Slocumb, MD while in the presence of Catheryn LOISE Slocumb, MD.  Subjective:  Patient ID: Kaitlyn Terry , female    DOB: [birth year] , 63 y.o.   MRN: [id number]  Chief Complaint  Patient presents with   Hypertension    Patient presents today for bp & thyroid  follow up. She reports compliance with medications. Denies headache, chest pain & sob. She complains of ear pain & sinus drainage. Denies fever & chills.  She does not have colonoscopy sch.    Hypothyroidism    HPI Discussed the use of AI scribe software for clinical note transcription with the patient, who gave verbal consent to proceed.  History of Present Illness Kaitlyn Terry is a 63 year old female with hypertension who presents for a blood pressure check.  She has not been monitoring her blood pressure at home and feels fine.  She underwent ulnar nerve release surgery on her right arm on December 04, 2023, due to ulnar nerve entrapment. Prior to surgery, she experienced sharp pain and numbness in two fingers. Post-surgery, she experienced significant discomfort for two weeks and has since completed physical therapy, being released last week.  She recalls having a colonoscopy in the past, possibly in [place], with Dr. Viktoria, who has since retired. During that procedure, polyps were removed.  She experiences ear pain, particularly in the right ear, and pain across her nose, which she associates with changes in barometric pressure. She took Zyrtec for relief.   Hypertension This is a chronic problem. The current episode started more than 1 year ago. The problem has been gradually improving since onset. The problem is controlled. Pertinent negatives include no blurred vision. Risk factors for coronary artery disease include obesity and sedentary lifestyle. The current  treatment provides moderate improvement. Compliance problems include exercise.      Past Medical History:  Diagnosis Date   Acute non-recurrent maxillary sinusitis 04/03/2022   Breast density    Hypertension    Pelvic pain    Thyroid  disease    Yeast vaginitis      Family History  Problem Relation Age of Onset   COPD Mother    Asthma Mother    Cancer Father    Allergies Father      Current Outpatient Medications:    Albuterol -Budesonide (AIRSUPRA ) 90-80 MCG/ACT AERO, Inhale 90 mcg into the lungs 2 (two) times daily as needed., Disp: 10 g, Rfl: 1   EPINEPHrine  0.3 mg/0.3 mL IJ SOAJ injection, Inject 0.3 mg into the muscle as needed for anaphylaxis., Disp: 1 each, Rfl: 1   fluticasone  (FLONASE ) 50 MCG/ACT nasal spray, Place 1 spray into both nostrils daily., Disp: 15.8 g, Rfl: 1   SYNTHROID  88 MCG tablet, TAKE 1 TABLET BY MOUTH DAILY, Disp: 90 tablet, Rfl: 2   amLODipine  (NORVASC ) 5 MG tablet, Take 1 tablet (5 mg total) by mouth daily., Disp: 90 tablet, Rfl: 1   Azelastine -Fluticasone  137-50 MCG/ACT SUSP, Place 1 spray into the nose in the morning and at bedtime. (Patient not taking: Reported on 02/11/2024), Disp: 23 g, Rfl: 5   Chlorphen-PE-Acetaminophen (NOREL AD) 4-10-325 MG TABS, One tab po twice daily prn, Disp: 20 tablet, Rfl: 0   cyclobenzaprine  (FLEXERIL ) 10 MG tablet, One tab po qpm prn (Patient not taking: Reported on 02/11/2024), Disp: 30 tablet, Rfl: 0  montelukast  (SINGULAIR ) 10 MG tablet, Take 1 tablet (10 mg total) by mouth at bedtime., Disp: 90 tablet, Rfl: 1   Vitamin D , Ergocalciferol , (DRISDOL ) 1.25 MG (50000 UNIT) CAPS capsule, TAKE 1 CAPSULE BY ORAL ROUTE once weekly (Patient not taking: Reported on 02/11/2024), Disp: 12 capsule, Rfl: 1   Allergies  Allergen Reactions   Peanuts [Peanut Oil]     Positive testing in the past. Has had vomiting.      Review of Systems  Constitutional: Negative.   Eyes:  Negative for blurred vision.  Respiratory: Negative.     Cardiovascular: Negative.   Gastrointestinal: Negative.   Neurological: Negative.   Psychiatric/Behavioral: Negative.       Today's Vitals   02/11/24 1413  BP: 112/78  Pulse: 68  Temp: 98.4 F (36.9 C)  SpO2: 98%  Weight: 163 lb 12.8 oz (74.3 kg)  Height: 5' 4 (1.626 m)   Body mass index is 28.12 kg/m.  Wt Readings from Last 3 Encounters:  02/11/24 163 lb 12.8 oz (74.3 kg)  10/26/23 160 lb 14.4 oz (73 kg)  09/21/23 163 lb 3.2 oz (74 kg)     Objective:  Physical Exam Vitals and nursing note reviewed.  Constitutional:      Appearance: Normal appearance.  HENT:     Head: Normocephalic and atraumatic.     Right Ear: Ear canal and external ear normal. There is impacted cerumen.     Left Ear: Tympanic membrane, ear canal and external ear normal.  Eyes:     Extraocular Movements: Extraocular movements intact.  Cardiovascular:     Rate and Rhythm: Normal rate and regular rhythm.     Heart sounds: Normal heart sounds.  Pulmonary:     Effort: Pulmonary effort is normal.     Breath sounds: Normal breath sounds.  Musculoskeletal:     Cervical back: Normal range of motion.  Skin:    General: Skin is warm.  Neurological:     General: No focal deficit present.     Mental Status: She is alert.  Psychiatric:        Mood and Affect: Mood normal.        Behavior: Behavior normal.         Assessment And Plan:  Essential hypertension, benign Assessment & Plan: Chronic, well controlled.  She will continue with amlodipine  5mg  daily. She is encouraged to follow low sodium diet.  She will rto in six months.   Orders: -     CMP14+EGFR  Primary hypothyroidism Assessment & Plan: Chronic, she is currently taking Synthroid  88cmg daily. I will check thyroid  panel and adjust meds as needed.   Orders: -     TSH + free T4  Pure hypercholesterolemia Assessment & Plan: Chronic, previous LDL very high at 193. Plan to assess for genetic predisposition to heart disease. - Order  lipoprotein test to assess for genetic predisposition to heart disease.  Orders: -     Lipoprotein A (LPA)  Non-seasonal allergic rhinitis due to pollen Assessment & Plan: Symptoms likely due to seasonal changes. Previously prescribed Norel AD and montelukast . - Refill montelukast  prescription. - Advise to start montelukast  in August next year. - Refill Norel AD prescription for use as needed.   Impacted cerumen of right ear Assessment & Plan: Pain in right ear likely due to cerumen impaction. - Recommend over-the-counter Debrox drops. - Advise 5-10 drops in right ear, sleep on left side with towel on pillow.   Ulnar nerve entrapment at elbow,  right Assessment & Plan: Right ulnar nerve entrapment, status post release Completed physical therapy with full range of motion post-surgery.   Other abnormal glucose Assessment & Plan: Previous labs reviewed, her A1c has been elevated in the past. I will check an A1c today. Reminded to avoid refined sugars including sugary drinks/foods and processed meats including bacon, sausages and deli meats.    Orders: -     CMP14+EGFR -     Hemoglobin A1c -     Lipoprotein A (LPA)  History of colon polyps Assessment & Plan: Previous colonoscopy revealed polyps. Referral needed for follow-up colonoscopy. - Print previous colonoscopy results and referral information. - Place referral for follow-up colonoscopy.  Orders: -     Ambulatory referral to Gastroenterology  Other orders -     amLODIPine  Besylate; Take 1 tablet (5 mg total) by mouth daily.  Dispense: 90 tablet; Refill: 1 -     Norel AD; One tab po twice daily prn  Dispense: 20 tablet; Refill: 0 -     Montelukast  Sodium; Take 1 tablet (10 mg total) by mouth at bedtime.  Dispense: 90 tablet; Refill: 1   Return if symptoms worsen or fail to improve.  Patient was given opportunity to ask questions. Patient verbalized understanding of the plan and was able to repeat key elements of the  plan. All questions were answered to their satisfaction.   I, Catheryn LOISE Slocumb, MD, have reviewed all documentation for this visit. The documentation on 02/11/24 for the exam, diagnosis, procedures, and orders are all accurate and complete.   IF YOU HAVE BEEN REFERRED TO A SPECIALIST, IT MAY TAKE 1-2 WEEKS TO SCHEDULE/PROCESS THE REFERRAL. IF YOU HAVE NOT HEARD FROM US /SPECIALIST IN TWO WEEKS, PLEASE GIVE US  A CALL AT [phone].   THE PATIENT IS ENCOURAGED TO PRACTICE SOCIAL DISTANCING DUE TO THE COVID-19 PANDEMIC.

## 2024-02-11 NOTE — Assessment & Plan Note (Signed)
 Previous labs reviewed, her A1c has been elevated in the past. I will check an A1c today. Reminded to avoid refined sugars including sugary drinks/foods and processed meats including bacon, sausages and deli meats.

## 2024-02-11 NOTE — Assessment & Plan Note (Signed)
 Symptoms likely due to seasonal changes. Previously prescribed Norel AD and montelukast . - Refill montelukast  prescription. - Advise to start montelukast  in August next year. - Refill Norel AD prescription for use as needed.

## 2024-02-11 NOTE — Assessment & Plan Note (Signed)
 Chronic, well controlled.  She will continue with amlodipine  5mg  daily. She is encouraged to follow low sodium diet.  She will rto in six months.

## 2024-02-11 NOTE — Patient Instructions (Signed)
 Hypertension, Adult Hypertension is another name for high blood pressure. High blood pressure forces your heart to work harder to pump blood. This can cause problems over time. There are two numbers in a blood pressure reading. There is a top number (systolic) over a bottom number (diastolic). It is best to have a blood pressure that is below 120/80. What are the causes? The cause of this condition is not known. Some other conditions can lead to high blood pressure. What increases the risk? Some lifestyle factors can make you more likely to develop high blood pressure: Smoking. Not getting enough exercise or physical activity. Being overweight. Having too much fat, sugar, calories, or salt (sodium) in your diet. Drinking too much alcohol. Other risk factors include: Having any of these conditions: Heart disease. Diabetes. High cholesterol. Kidney disease. Obstructive sleep apnea. Having a family history of high blood pressure and high cholesterol. Age. The risk increases with age. Stress. What are the signs or symptoms? High blood pressure may not cause symptoms. Very high blood pressure (hypertensive crisis) may cause: Headache. Fast or uneven heartbeats (palpitations). Shortness of breath. Nosebleed. Vomiting or feeling like you may vomit (nauseous). Changes in how you see. Very bad chest pain. Feeling dizzy. Seizures. How is this treated? This condition is treated by making healthy lifestyle changes, such as: Eating healthy foods. Exercising more. Drinking less alcohol. Your doctor may prescribe medicine if lifestyle changes do not help enough and if: Your top number is above 130. Your bottom number is above 80. Your personal target blood pressure may vary. Follow these instructions at home: Eating and drinking  If told, follow the DASH eating plan. To follow this plan: Fill one half of your plate at each meal with fruits and vegetables. Fill one fourth of your plate  at each meal with whole grains. Whole grains include whole-wheat pasta, brown rice, and whole-grain bread. Eat or drink low-fat dairy products, such as skim milk or low-fat yogurt. Fill one fourth of your plate at each meal with low-fat (lean) proteins. Low-fat proteins include fish, chicken without skin, eggs, beans, and tofu. Avoid fatty meat, cured and processed meat, or chicken with skin. Avoid pre-made or processed food. Limit the amount of salt in your diet to less than 1,500 mg each day. Do not drink alcohol if: Your doctor tells you not to drink. You are pregnant, may be pregnant, or are planning to become pregnant. If you drink alcohol: Limit how much you have to: 0-1 drink a day for women. 0-2 drinks a day for men. Know how much alcohol is in your drink. In the [place] one drink equals one 12 oz bottle of beer (355 mL), one 5 oz glass of wine (148 mL), or one 1 oz glass of hard liquor (44 mL). Lifestyle  Work with your doctor to stay at a healthy weight or to lose weight. Ask your doctor what the best weight is for you. Get at least 30 minutes of exercise that causes your heart to beat faster (aerobic exercise) most days of the week. This may include walking, swimming, or biking. Get at least 30 minutes of exercise that strengthens your muscles (resistance exercise) at least 3 days a week. This may include lifting weights or doing Pilates. Do not smoke or use any products that contain nicotine or tobacco. If you need help quitting, ask your doctor. Check your blood pressure at home as told by your doctor. Keep all follow-up visits. Medicines Take over-the-counter and prescription medicines  only as told by your doctor. Follow directions carefully. Do not skip doses of blood pressure medicine. The medicine does not work as well if you skip doses. Skipping doses also puts you at risk for problems. Ask your doctor about side effects or reactions to medicines that you should watch  for. Contact a doctor if: You think you are having a reaction to the medicine you are taking. You have headaches that keep coming back. You feel dizzy. You have swelling in your ankles. You have trouble with your vision. Get help right away if: You get a very bad headache. You start to feel mixed up (confused). You feel weak or numb. You feel faint. You have very bad pain in your: Chest. Belly (abdomen). You vomit more than once. You have trouble breathing. These symptoms may be an emergency. Get help right away. Call 911. Do not wait to see if the symptoms will go away. Do not drive yourself to the hospital. Summary Hypertension is another name for high blood pressure. High blood pressure forces your heart to work harder to pump blood. For most people, a normal blood pressure is less than 120/80. Making healthy choices can help lower blood pressure. If your blood pressure does not get lower with healthy choices, you may need to take medicine. This information is not intended to replace advice given to you by your health care provider. Make sure you discuss any questions you have with your health care provider. Document Revised: 03/14/2021 Document Reviewed: 03/14/2021 Elsevier Patient Education  2024 [employer].

## 2024-02-11 NOTE — Assessment & Plan Note (Signed)
 Chronic, she is currently taking Synthroid 88cmg daily. I will check thyroid panel and adjust meds as needed.

## 2024-02-11 NOTE — Assessment & Plan Note (Signed)
 Previous colonoscopy revealed polyps. Referral needed for follow-up colonoscopy. - Print previous colonoscopy results and referral information. - Place referral for follow-up colonoscopy.

## 2024-02-11 NOTE — Assessment & Plan Note (Signed)
 Chronic, previous LDL very high at 193. Plan to assess for genetic predisposition to heart disease. - Order lipoprotein test to assess for genetic predisposition to heart disease.

## 2024-02-11 NOTE — Assessment & Plan Note (Signed)
 Pain in right ear likely due to cerumen impaction. - Recommend over-the-counter Debrox drops. - Advise 5-10 drops in right ear, sleep on left side with towel on pillow.

## 2024-02-12 LAB — LIPOPROTEIN A (LPA): Lipoprotein (a): 111.7 nmol/L — ABNORMAL HIGH (ref <75.0)

## 2024-02-12 LAB — CMP14+EGFR
ALT: 15 IU/L (ref 0–32)
AST: 19 IU/L (ref 0–40)
Albumin: 4.5 g/dL (ref 3.9–4.9)
Alkaline Phosphatase: 118 IU/L (ref 44–121)
BUN/Creatinine Ratio: 14 (ref 12–28)
BUN: 11 mg/dL (ref 8–27)
Bilirubin Total: 0.3 mg/dL (ref 0.0–1.2)
CO2: 26 mmol/L (ref 20–29)
Calcium: 10 mg/dL (ref 8.7–10.3)
Chloride: 104 mmol/L (ref 96–106)
Creatinine, Ser: 0.77 mg/dL (ref 0.57–1.00)
Globulin, Total: 3 g/dL (ref 1.5–4.5)
Glucose: 76 mg/dL (ref 70–99)
Potassium: 4.3 mmol/L (ref 3.5–5.2)
Sodium: 145 mmol/L — ABNORMAL HIGH (ref 134–144)
Total Protein: 7.5 g/dL (ref 6.0–8.5)
eGFR: 87 mL/min/1.73 (ref >59)

## 2024-02-12 LAB — HEMOGLOBIN A1C
Est. average glucose Bld gHb Est-mCnc: 123 mg/dL
Hgb A1c MFr Bld: 5.9 % — ABNORMAL HIGH (ref 4.8–5.6)

## 2024-02-12 LAB — TSH+FREE T4
Free T4: 1.38 ng/dL (ref 0.82–1.77)
TSH: 0.191 u[IU]/mL — ABNORMAL LOW (ref 0.450–4.500)

## 2024-02-15 ENCOUNTER — Ambulatory Visit: Payer: Self-pay | Admitting: Internal Medicine

## 2024-02-15 DIAGNOSIS — G5621 Lesion of ulnar nerve, right upper limb: Secondary | ICD-10-CM | POA: Insufficient documentation

## 2024-02-15 DIAGNOSIS — E039 Hypothyroidism, unspecified: Secondary | ICD-10-CM

## 2024-02-15 NOTE — Assessment & Plan Note (Signed)
 Right ulnar nerve entrapment, status post release Completed physical therapy with full range of motion post-surgery.

## 2024-02-19 ENCOUNTER — Ambulatory Visit (INDEPENDENT_AMBULATORY_CARE_PROVIDER_SITE_OTHER)

## 2024-02-19 DIAGNOSIS — J309 Allergic rhinitis, unspecified: Secondary | ICD-10-CM

## 2024-02-24 ENCOUNTER — Other Ambulatory Visit: Payer: Self-pay | Admitting: Internal Medicine

## 2024-02-24 ENCOUNTER — Ambulatory Visit (INDEPENDENT_AMBULATORY_CARE_PROVIDER_SITE_OTHER)

## 2024-02-24 DIAGNOSIS — E039 Hypothyroidism, unspecified: Secondary | ICD-10-CM

## 2024-02-24 DIAGNOSIS — J309 Allergic rhinitis, unspecified: Secondary | ICD-10-CM | POA: Diagnosis not present

## 2024-02-25 ENCOUNTER — Other Ambulatory Visit (HOSPITAL_COMMUNITY): Payer: Self-pay

## 2024-02-25 ENCOUNTER — Other Ambulatory Visit: Payer: Self-pay

## 2024-02-25 ENCOUNTER — Other Ambulatory Visit: Payer: Self-pay | Admitting: Internal Medicine

## 2024-02-25 ENCOUNTER — Other Ambulatory Visit (HOSPITAL_BASED_OUTPATIENT_CLINIC_OR_DEPARTMENT_OTHER): Payer: Self-pay

## 2024-02-25 DIAGNOSIS — E039 Hypothyroidism, unspecified: Secondary | ICD-10-CM

## 2024-02-25 MED ORDER — SYNTHROID 88 MCG PO TABS
88.0000 ug | ORAL_TABLET | Freq: Every day | ORAL | 2 refills | Status: DC
  Filled 2024-02-25: qty 78, 80d supply, fill #0
  Filled 2024-02-25: qty 60, 76d supply, fill #0

## 2024-02-25 MED ORDER — SYNTHROID 88 MCG PO TABS: 88.0000 ug | ORAL_TABLET | Freq: Every day | ORAL | 2 refills | Status: DC

## 2024-02-25 NOTE — Telephone Encounter (Signed)
 Copied from CRM [id number]. Topic: Clinical - Prescription Issue >> Feb 25, 2024 11:12 AM Kaitlyn Terry wrote: Reason for CRM: Patient states that she has a prescription sent over to: [id number] - [place], [place] - [address] For: SYNTHROID  88 MCG tablet But the pharmacy states that it is out of stock. It has been 2 days and still nothing. Patient would like to know if she can either go into the practice for a sample or if there are alternative pharmacies she can pick up the medication in

## 2024-03-03 ENCOUNTER — Ambulatory Visit (INDEPENDENT_AMBULATORY_CARE_PROVIDER_SITE_OTHER)

## 2024-03-03 DIAGNOSIS — J309 Allergic rhinitis, unspecified: Secondary | ICD-10-CM | POA: Diagnosis not present

## 2024-03-09 ENCOUNTER — Ambulatory Visit

## 2024-03-09 DIAGNOSIS — J309 Allergic rhinitis, unspecified: Secondary | ICD-10-CM

## 2024-03-10 ENCOUNTER — Other Ambulatory Visit: Payer: Self-pay

## 2024-03-10 DIAGNOSIS — E039 Hypothyroidism, unspecified: Secondary | ICD-10-CM

## 2024-03-10 MED ORDER — SYNTHROID 88 MCG PO TABS
88.0000 ug | ORAL_TABLET | Freq: Every day | ORAL | 2 refills | Status: AC
Start: 2024-03-10 — End: ?

## 2024-03-17 ENCOUNTER — Ambulatory Visit (INDEPENDENT_AMBULATORY_CARE_PROVIDER_SITE_OTHER)

## 2024-03-17 DIAGNOSIS — J309 Allergic rhinitis, unspecified: Secondary | ICD-10-CM

## 2024-03-25 ENCOUNTER — Ambulatory Visit (INDEPENDENT_AMBULATORY_CARE_PROVIDER_SITE_OTHER)

## 2024-03-25 ENCOUNTER — Other Ambulatory Visit: Payer: Self-pay

## 2024-03-25 ENCOUNTER — Encounter: Payer: Self-pay | Admitting: Internal Medicine

## 2024-03-25 DIAGNOSIS — J309 Allergic rhinitis, unspecified: Secondary | ICD-10-CM | POA: Diagnosis not present

## 2024-03-25 DIAGNOSIS — E039 Hypothyroidism, unspecified: Secondary | ICD-10-CM

## 2024-03-25 MED ORDER — SYNTHROID 88 MCG PO TABS: 88.0000 ug | ORAL_TABLET | Freq: Every day | ORAL | 2 refills | Status: DC

## 2024-03-29 ENCOUNTER — Other Ambulatory Visit: Payer: Self-pay

## 2024-03-29 DIAGNOSIS — E039 Hypothyroidism, unspecified: Secondary | ICD-10-CM

## 2024-03-29 MED ORDER — SYNTHROID 88 MCG PO TABS: 88.0000 ug | ORAL_TABLET | Freq: Every day | ORAL | 2 refills | Status: DC

## 2024-03-31 ENCOUNTER — Other Ambulatory Visit: Payer: Self-pay

## 2024-03-31 ENCOUNTER — Ambulatory Visit (INDEPENDENT_AMBULATORY_CARE_PROVIDER_SITE_OTHER)

## 2024-03-31 ENCOUNTER — Ambulatory Visit: Admitting: Internal Medicine

## 2024-03-31 ENCOUNTER — Encounter: Payer: Self-pay | Admitting: Internal Medicine

## 2024-03-31 VITALS — BP 128/82 | HR 64 | Temp 98.4°F | Ht 64.0 in | Wt 163.8 lb

## 2024-03-31 DIAGNOSIS — Z860100 Personal history of colon polyps, unspecified: Secondary | ICD-10-CM

## 2024-03-31 DIAGNOSIS — G478 Other sleep disorders: Secondary | ICD-10-CM | POA: Diagnosis not present

## 2024-03-31 DIAGNOSIS — E039 Hypothyroidism, unspecified: Secondary | ICD-10-CM

## 2024-03-31 DIAGNOSIS — H9203 Otalgia, bilateral: Secondary | ICD-10-CM | POA: Diagnosis not present

## 2024-03-31 DIAGNOSIS — J309 Allergic rhinitis, unspecified: Secondary | ICD-10-CM

## 2024-03-31 DIAGNOSIS — H6121 Impacted cerumen, right ear: Secondary | ICD-10-CM | POA: Diagnosis not present

## 2024-03-31 DIAGNOSIS — Z1211 Encounter for screening for malignant neoplasm of colon: Secondary | ICD-10-CM

## 2024-03-31 DIAGNOSIS — R4189 Other symptoms and signs involving cognitive functions and awareness: Secondary | ICD-10-CM

## 2024-03-31 MED ORDER — SYNTHROID 88 MCG PO TABS: ORAL_TABLET | ORAL | 2 refills | Status: AC

## 2024-03-31 NOTE — Patient Instructions (Signed)
Earache, Adult An earache, or ear pain, can be caused by many things, including: An infection. Ear wax buildup. Ear pressure. Something in the ear that should not be there (foreign body). A sore throat. Tooth problems. Jaw problems. Treatment of the earache will depend on the cause. If the cause is not clear or cannot be known, you may need to watch your symptoms until your earache goes away or until a cause is found. Follow these instructions at home: Medicines Take or apply over-the-counter and prescription medicines only as told by your health care provider. If you were prescribed antibiotics, use them as told by your health care provider. Do not stop using the antibiotic even if you start to feel better. Do not put anything in your ear other than medicine that is prescribed by your health care provider. Managing pain     If directed, apply heat to the affected area as often as told by your health care provider. Use the heat source that your health care provider recommends, such as a moist heat pack or a heating pad. Place a towel between your skin and the heat source. Leave the heat on for 20-30 minutes. If your skin turns bright red, remove the heat right away to prevent burns. The risk of burns is higher if you cannot feel pain, heat, or cold. If directed, put ice on the affected area. To do this: Put ice in a plastic bag. Place a towel between your skin and the bag. Leave the ice on for 20 minutes, 2-3 times a day. If your skin turns bright red, remove the ice right away to prevent skin damage. The risk of skin damage is higher if you cannot feel pain, heat, or cold.  General instructions Pay attention to any changes in your symptoms. Try resting in an upright position instead of lying down. This may help to reduce pressure in your ear and relieve pain. Chew gum if it helps to relieve your ear pain. Treat any allergies as told by your health care provider. Drink enough fluid  to keep your urine pale yellow. It is up to you to get the results of any tests that were done. Ask your health care provider, or the department that is doing the tests, when your results will be ready. Contact a health care provider if: Your pain does not improve within 2 days. Your earache gets worse. You have new symptoms. You have a fever. Get help right away if: You have a severe headache. You have a stiff neck. You have trouble swallowing. You have redness or swelling behind your ear. You have fluid or blood coming from your ear. You have hearing loss. You feel dizzy. This information is not intended to replace advice given to you by your health care provider. Make sure you discuss any questions you have with your health care provider. Document Revised: 10/07/2021 Document Reviewed: 10/07/2021 Elsevier Patient Education  2024 [employer]  

## 2024-03-31 NOTE — Progress Notes (Signed)
 I,Victoria T Emmitt, CMA,acting as a neurosurgeon for Catheryn LOISE Slocumb, MD.,have documented all relevant documentation on the behalf of Catheryn LOISE Slocumb, MD,as directed by  Catheryn LOISE Slocumb, MD while in the presence of Catheryn LOISE Slocumb, MD.  Subjective:  Patient ID: Kaitlyn Terry , female    DOB: [birth year] , 63 y.o.   MRN: [id number]  Chief Complaint  Patient presents with   Otalgia    Patient presents today for ear pain. She reports going to get her ears flushed at local Urgent care 3 weeks ago. Yesterday she experienced a shooting pain in left ear. She feels there is water in her ears. She also experiences sinus drainage. Denes fever/chills.  She has not yet completed colonoscopy.     HPI Discussed the use of AI scribe software for clinical note transcription with the patient, who gave verbal consent to proceed.  History of Present Illness Kaitlyn Terry is a 63 year old female who presents with bilateral ear pain and a sensation of imbalance.  She has been experiencing bilateral ear pain for three weeks, which began after having her ears flushed. She feels as though there is water or wax remaining in her ears, and she does not feel normal. A few days ago, she experienced a shooting pain originating from her left ear. She takes Norel for ear issues but has not been taking it consistently.  She reports a sensation of imbalance and occasional 'brain fog' during meetings, although she believes she can hear what is being said. She sometimes experiences a delayed reaction, which she describes as a processing issue. She reports getting adequate sleep, but sometimes feels tired upon waking. Her husband notes that she snores, and she occasionally wakes up with headaches. She denies waking up to use the restroom at night. Her sister has sleep apnea and has undergone surgery and Inspire placement.  She is on Synthroid , taking 88 mcg daily and skipping Sundays, and has a lab visit scheduled for December 1st. She  lives in [place], previously lived in [place].   Past Medical History:  Diagnosis Date   Acute non-recurrent maxillary sinusitis 04/03/2022   Breast density    Hypertension    Pelvic pain    Thyroid  disease    Yeast vaginitis      Family History  Problem Relation Age of Onset   COPD Mother    Asthma Mother    Cancer Father    Allergies Father      Current Outpatient Medications:    Albuterol -Budesonide (AIRSUPRA ) 90-80 MCG/ACT AERO, Inhale 90 mcg into the lungs 2 (two) times daily as needed., Disp: 10 g, Rfl: 1   amLODipine  (NORVASC ) 5 MG tablet, Take 1 tablet (5 mg total) by mouth daily., Disp: 90 tablet, Rfl: 1   Chlorphen-PE-Acetaminophen (NOREL AD) 4-10-325 MG TABS, One tab po twice daily prn, Disp: 20 tablet, Rfl: 0   EPINEPHrine  0.3 mg/0.3 mL IJ SOAJ injection, Inject 0.3 mg into the muscle as needed for anaphylaxis., Disp: 1 each, Rfl: 1   fluticasone  (FLONASE ) 50 MCG/ACT nasal spray, Place 1 spray into both nostrils daily., Disp: 15.8 g, Rfl: 1   montelukast  (SINGULAIR ) 10 MG tablet, Take 1 tablet (10 mg total) by mouth at bedtime., Disp: 90 tablet, Rfl: 1   Azelastine -Fluticasone  137-50 MCG/ACT SUSP, Place 1 spray into the nose in the morning and at bedtime. (Patient not taking: Reported on 03/31/2024), Disp: 23 g, Rfl: 5   cyclobenzaprine  (FLEXERIL ) 10 MG tablet, One tab  po qpm prn (Patient not taking: Reported on 03/31/2024), Disp: 30 tablet, Rfl: 0   SYNTHROID  88 MCG tablet, Take one tab daily, skip SUNDAYS, Disp: 90 tablet, Rfl: 2   Vitamin D , Ergocalciferol , (DRISDOL ) 1.25 MG (50000 UNIT) CAPS capsule, TAKE 1 CAPSULE BY ORAL ROUTE once weekly (Patient not taking: Reported on 03/31/2024), Disp: 12 capsule, Rfl: 1   Allergies  Allergen Reactions   Peanuts [Peanut Oil]     Positive testing in the past. Has had vomiting.      Review of Systems  Constitutional: Negative.   Respiratory: Negative.    Cardiovascular: Negative.   Neurological: Negative.    Psychiatric/Behavioral: Negative.       Today's Vitals   03/31/24 1052  BP: 128/82  Pulse: 64  Temp: 98.4 F (36.9 C)  SpO2: 98%  Weight: 163 lb 12.8 oz (74.3 kg)  Height: 5' 4 (1.626 m)   Body mass index is 28.12 kg/m.  Wt Readings from Last 3 Encounters:  03/31/24 163 lb 12.8 oz (74.3 kg)  02/11/24 163 lb 12.8 oz (74.3 kg)  10/26/23 160 lb 14.4 oz (73 kg)     Objective:  Physical Exam Vitals and nursing note reviewed.  Constitutional:      Appearance: Normal appearance.  HENT:     Head: Normocephalic and atraumatic.     Right Ear: Ear canal and external ear normal. There is impacted cerumen.     Left Ear: Ear canal and external ear normal.     Ears:     Comments: Dry skin in left canal Eyes:     Extraocular Movements: Extraocular movements intact.  Cardiovascular:     Rate and Rhythm: Normal rate and regular rhythm.     Heart sounds: Normal heart sounds.  Pulmonary:     Effort: Pulmonary effort is normal.     Breath sounds: Normal breath sounds.  Skin:    General: Skin is warm.  Neurological:     General: No focal deficit present.     Mental Status: She is alert.  Psychiatric:        Mood and Affect: Mood normal.        Behavior: Behavior normal.         Assessment And Plan:  Ear pain, bilateral Assessment & Plan: Bilateral ear pain and fullness with possible fluid behind eardrum. Right ear cerumen accumulation, left ear skin layer possibly trapping water. Reports imbalance and brain fog possibly related to ear issues. - Refer to ENT for evaluation and management. - Administer Norel to dry fluid behind eardrum.   Orders: -     Ambulatory referral to ENT  Impacted cerumen of right ear Assessment & Plan: AFTER OBTAINING VERBAL CONSENT, RIGHT EAR WAS FLUSHED BY IRRIGATION. SHE TOLERATED PROCEDURE WELL WITHOUT ANY COMPLICATIONS. NO TM ABNORMALITIES WERE NOTED.   Orders: -     Ear Lavage  Primary hypothyroidism Assessment & Plan: Managed with  Synthroid , recent dosage adjustment. - Send prescription to Synthroid  pharmacy. - Schedule lab visit for December 1st to monitor thyroid  levels.  Orders: -     Synthroid ; Take one tab daily, skip SUNDAYS  Dispense: 90 tablet; Refill: 2  Non-restorative sleep Assessment & Plan: Possible sleep apnea contributing to symptoms. Discussed potential effects on sleep quality, including hypoxia. - Refer to neurologist for assessment and potential sleep study.  Orders: -     Ambulatory referral to Neurology  Brain fog -     Vitamin B12  Personal history of colon  polyps, unspecified Assessment & Plan: Colonic polyps with difficulty obtaining previous colonoscopy results. No family history of colorectal cancer. - Sign release form to obtain previous colonoscopy results from [facility] facility.   General Health Maintenance Discussed omega-3 intake due to lack of fish consumption and nut allergy . - Advise incorporating chia seeds into diet for omega-3 intake.  Return if symptoms worsen or fail to improve.  Patient was given opportunity to ask questions. Patient verbalized understanding of the plan and was able to repeat key elements of the plan. All questions were answered to their satisfaction.   I, Catheryn LOISE Slocumb, MD, have reviewed all documentation for this visit. The documentation on  for the exam, diagnosis, procedures, and orders are all accurate and complete.   IF YOU HAVE BEEN REFERRED TO A SPECIALIST, IT MAY TAKE 1-2 WEEKS TO SCHEDULE/PROCESS THE REFERRAL. IF YOU HAVE NOT HEARD FROM US /SPECIALIST IN TWO WEEKS, PLEASE GIVE US  A CALL AT [phone].   THE PATIENT IS ENCOURAGED TO PRACTICE SOCIAL DISTANCING DUE TO THE COVID-19 PANDEMIC.

## 2024-04-01 ENCOUNTER — Ambulatory Visit: Payer: Self-pay | Admitting: Internal Medicine

## 2024-04-01 LAB — VITAMIN B12: Vitamin B-12: 506 pg/mL (ref 232–1245)

## 2024-04-03 DIAGNOSIS — G478 Other sleep disorders: Secondary | ICD-10-CM | POA: Insufficient documentation

## 2024-04-03 DIAGNOSIS — H9203 Otalgia, bilateral: Secondary | ICD-10-CM | POA: Insufficient documentation

## 2024-04-03 NOTE — Assessment & Plan Note (Signed)
 Possible sleep apnea contributing to symptoms. Discussed potential effects on sleep quality, including hypoxia. - Refer to neurologist for assessment and potential sleep study.

## 2024-04-03 NOTE — Assessment & Plan Note (Signed)
 Bilateral ear pain and fullness with possible fluid behind eardrum. Right ear cerumen accumulation, left ear skin layer possibly trapping water. Reports imbalance and brain fog possibly related to ear issues. - Refer to ENT for evaluation and management. - Administer Norel to dry fluid behind eardrum.

## 2024-04-03 NOTE — Assessment & Plan Note (Signed)
AFTER OBTAINING VERBAL CONSENT, RIGHT EAR WAS FLUSHED BY IRRIGATION. SHE TOLERATED PROCEDURE WELL WITHOUT ANY COMPLICATIONS. NO TM ABNORMALITIES WERE NOTED.

## 2024-04-03 NOTE — Assessment & Plan Note (Signed)
 Colonic polyps with difficulty obtaining previous colonoscopy results. No family history of colorectal cancer. - Sign release form to obtain previous colonoscopy results from [facility] facility.

## 2024-04-03 NOTE — Assessment & Plan Note (Signed)
 Managed with Synthroid , recent dosage adjustment. - Send prescription to Synthroid  pharmacy. - Schedule lab visit for December 1st to monitor thyroid  levels.

## 2024-04-08 ENCOUNTER — Ambulatory Visit (INDEPENDENT_AMBULATORY_CARE_PROVIDER_SITE_OTHER): Admitting: *Deleted

## 2024-04-08 DIAGNOSIS — J309 Allergic rhinitis, unspecified: Secondary | ICD-10-CM | POA: Diagnosis not present

## 2024-04-11 ENCOUNTER — Encounter: Payer: Self-pay | Admitting: Internal Medicine

## 2024-04-12 ENCOUNTER — Encounter (INDEPENDENT_AMBULATORY_CARE_PROVIDER_SITE_OTHER): Payer: Self-pay | Admitting: Physician Assistant

## 2024-04-12 ENCOUNTER — Ambulatory Visit (INDEPENDENT_AMBULATORY_CARE_PROVIDER_SITE_OTHER): Admitting: Physician Assistant

## 2024-04-12 VITALS — BP 118/73 | HR 75 | Ht 64.0 in | Wt 163.0 lb

## 2024-04-12 DIAGNOSIS — J0101 Acute recurrent maxillary sinusitis: Secondary | ICD-10-CM

## 2024-04-12 DIAGNOSIS — J309 Allergic rhinitis, unspecified: Secondary | ICD-10-CM

## 2024-04-12 DIAGNOSIS — J0191 Acute recurrent sinusitis, unspecified: Secondary | ICD-10-CM

## 2024-04-12 DIAGNOSIS — H6123 Impacted cerumen, bilateral: Secondary | ICD-10-CM

## 2024-04-12 DIAGNOSIS — H9202 Otalgia, left ear: Secondary | ICD-10-CM | POA: Diagnosis not present

## 2024-04-13 NOTE — Progress Notes (Signed)
 Dear Dr. Jarold, Here is my assessment for our mutual patient, Kaitlyn Terry. Thank you for allowing me the opportunity to care for your patient. Please do not hesitate to contact me should you have any other questions. Sincerely, Kaitlyn Cohen PA-C  Otolaryngology Clinic Note Referring provider: Dr. Jarold HPI:  Kaitlyn Terry is a 63 y.o. female kindly referred by Dr. Jarold   Discussed the use of AI scribe software for clinical note transcription with the patient, who gave verbal consent to proceed.  History of Present Illness   Kaitlyn Terry is a 63 year old female who presents with recurrent ear pain and wax buildup.  She experiences frequent sinus infections, approximately two to three times per year, which typically resolve with antibiotics. She has a history of seasonal allergies and takes Zyrtec as needed. She has been receiving allergy  shots for the past two to three months and occasionally uses Flonase  when congested.  She has had her ears flushed three times this year due to excessive wax buildup, with the most recent flush occurring two weeks ago. Despite these flushes, she continues to experience a sensation of water in her ears and intermittent ear pain, particularly in the left ear. The pain is described as shooting and occurs more frequently in the morning. She also reports a sensation of her equilibrium being off for several months, and she now attributes this to her ear issues.  She uses Debrox for ear wax management. Her hearing is generally good, although she has experienced muffled sounds in the past when her ears felt blocked. No persistent sinus congestion or pressure outside of her sinus infections.  She does not recall having frequent ear infections as a child. She has not experienced any significant hearing loss or persistent dizziness, although she notes a sensation of imbalance that has been present for months.           Independent Review of Additional Tests or  Records:  none   PMH/Meds/All/SocHx/FamHx/ROS:   Past Medical History:  Diagnosis Date   Acute non-recurrent maxillary sinusitis 04/03/2022   Breast density    Hypertension    Pelvic pain    Thyroid  disease    Yeast vaginitis      Past Surgical History:  Procedure Laterality Date   ABDOMINAL HYSTERECTOMY     ULNAR NERVE DECOMPRESSION Right 12/04/2023    Family History  Problem Relation Age of Onset   COPD Mother    Asthma Mother    Cancer Father    Allergies Father      Social Connections: Socially Integrated (09/20/2023)   Social Connection and Isolation Panel    Frequency of Communication with Friends and Family: Three times a week    Frequency of Social Gatherings with Friends and Family: Three times a week    Attends Religious Services: 1 to 4 times per year    Active Member of Clubs or Organizations: Yes    Attends Banker Meetings: 1 to 4 times per year    Marital Status: Married      Current Outpatient Medications:    Albuterol -Budesonide (AIRSUPRA ) 90-80 MCG/ACT AERO, Inhale 90 mcg into the lungs 2 (two) times daily as needed., Disp: 10 g, Rfl: 1   amLODipine  (NORVASC ) 5 MG tablet, Take 1 tablet (5 mg total) by mouth daily., Disp: 90 tablet, Rfl: 1   Azelastine -Fluticasone  137-50 MCG/ACT SUSP, Place 1 spray into the nose in the morning and at bedtime. (Patient not taking: Reported on 03/31/2024), Disp:  23 g, Rfl: 5   Chlorphen-PE-Acetaminophen (NOREL AD) 4-10-325 MG TABS, One tab po twice daily prn, Disp: 20 tablet, Rfl: 0   cyclobenzaprine  (FLEXERIL ) 10 MG tablet, One tab po qpm prn (Patient not taking: Reported on 03/31/2024), Disp: 30 tablet, Rfl: 0   EPINEPHrine  0.3 mg/0.3 mL IJ SOAJ injection, Inject 0.3 mg into the muscle as needed for anaphylaxis., Disp: 1 each, Rfl: 1   fluticasone  (FLONASE ) 50 MCG/ACT nasal spray, Place 1 spray into both nostrils daily., Disp: 15.8 g, Rfl: 1   montelukast  (SINGULAIR ) 10 MG tablet, Take 1 tablet (10 mg  total) by mouth at bedtime., Disp: 90 tablet, Rfl: 1   SYNTHROID  88 MCG tablet, Take one tab daily, skip SUNDAYS, Disp: 90 tablet, Rfl: 2   Vitamin D , Ergocalciferol , (DRISDOL ) 1.25 MG (50000 UNIT) CAPS capsule, TAKE 1 CAPSULE BY ORAL ROUTE once weekly (Patient not taking: Reported on 03/31/2024), Disp: 12 capsule, Rfl: 1   Physical Exam:   BP 118/73   Pulse 75   Ht 5' 4 (1.626 m)   Wt 163 lb (73.9 kg)   SpO2 92%   BMI 27.98 kg/m   Pertinent Findings  CN II-XII grossly intact Bilateral EAC with minimal cerumen and TM intact with well pneumatized middle ear spaces Weber 512: equal Rinne 512: AC > BC b/l  Anterior rhinoscopy: Septum midline; bilateral inferior turbinates with minimal hypertrophy No lesions of oral cavity/oropharynx; dentition wnl No obviously palpable neck masses/lymphadenopathy/thyromegaly No respiratory distress or stridor No pain at the TMJs   Seprately Identifiable Procedures:  None  Impression & Plans:  Kaitlyn Terry is a 63 y.o. female with the following   Assessment and Plan    Ceruminosis-  Previous cerumen impaction status post irrigation with ongoing intermittent pain in the left ear, suspect this was secondary to irrigation.  -Audiological evaluation ordered - Advised against ear irrigation due to potential trauma. - Recommended regular ENT cleaning to prevent wax buildup. - Instructed to contact ENT for cleaning when wax buildup occurs.  Recurrent acute sinusitis and allergic rhinitis Recurrent acute sinusitis responsive to antibiotics. Allergic rhinitis managed with allergy  shots and Flonase . No current sinus congestion. Not at threshold for sinus surgery. Aggressive allergy  management advised. - Continue allergy  shots. - Use Flonase  consistently during allergy  seasons. - Monitor for increased sinus issues.  Equilibrium disturbance Equilibrium disturbance possibly related to ear issues. No significant hearing loss or fluid. Differential  includes eustachian tube dysfunction and potential inner ear issues. - Ordered hearing test to evaluate eustachian tube dysfunction and balance nerve issues. - Monitor for worsening dizziness or vertigo and report if symptoms persist.           - f/u phone call discussion with audiological results   Thank you for allowing me the opportunity to care for your patient. Please do not hesitate to contact me should you have any other questions.  Sincerely, Kaitlyn Cohen PA-C [facility] Phone: [phone] Fax: [phone]  04/13/2024, 2:41 PM

## 2024-04-14 ENCOUNTER — Ambulatory Visit (INDEPENDENT_AMBULATORY_CARE_PROVIDER_SITE_OTHER)

## 2024-04-14 DIAGNOSIS — J309 Allergic rhinitis, unspecified: Secondary | ICD-10-CM | POA: Diagnosis not present

## 2024-04-21 ENCOUNTER — Ambulatory Visit (INDEPENDENT_AMBULATORY_CARE_PROVIDER_SITE_OTHER)

## 2024-04-21 DIAGNOSIS — J309 Allergic rhinitis, unspecified: Secondary | ICD-10-CM

## 2024-04-27 ENCOUNTER — Ambulatory Visit: Admitting: Internal Medicine

## 2024-04-27 ENCOUNTER — Ambulatory Visit

## 2024-04-27 DIAGNOSIS — J309 Allergic rhinitis, unspecified: Secondary | ICD-10-CM

## 2024-05-02 ENCOUNTER — Ambulatory Visit (INDEPENDENT_AMBULATORY_CARE_PROVIDER_SITE_OTHER)

## 2024-05-02 ENCOUNTER — Institutional Professional Consult (permissible substitution): Admitting: Neurology

## 2024-05-02 ENCOUNTER — Ambulatory Visit: Admitting: Internal Medicine

## 2024-05-02 VITALS — BP 126/72 | HR 84 | Resp 20 | Wt 164.7 lb

## 2024-05-02 DIAGNOSIS — J3089 Other allergic rhinitis: Secondary | ICD-10-CM

## 2024-05-02 DIAGNOSIS — J309 Allergic rhinitis, unspecified: Secondary | ICD-10-CM | POA: Diagnosis not present

## 2024-05-02 DIAGNOSIS — J4520 Mild intermittent asthma, uncomplicated: Secondary | ICD-10-CM

## 2024-05-02 DIAGNOSIS — J302 Other seasonal allergic rhinitis: Secondary | ICD-10-CM

## 2024-05-02 DIAGNOSIS — T7800XD Anaphylactic reaction due to unspecified food, subsequent encounter: Secondary | ICD-10-CM | POA: Diagnosis not present

## 2024-05-02 DIAGNOSIS — T7800XA Anaphylactic reaction due to unspecified food, initial encounter: Secondary | ICD-10-CM

## 2024-05-02 NOTE — Patient Instructions (Addendum)
 Recurrent Sinus Infections  Allergic Rhinitis    Allergy  test 04/01/23: Positive to grass, weeds, trees, mold, dust mite, dog, mixed feathers, roach  Plan:  - Continue Dymista  nasal spray: use 1-2 sprays in each nostril daily  - Consider daily antihistamine such as Zyrtec 10 mg  - Continue avoidance measures  - Continue allergy  injections per protocol and carry epipen  on injections day    Nut Allergy  -Allergy  test 04/01/23: Positive to peanut, pecan, hazelnut -Continue avoidance of peanuts and tree nuts -EpiPen  and food allergy  action plan provided today  Reactive Airway Disease: mild intermittent Use of Airsupra  for cough. Improvement noted with use; triggered by URI  -Continue to air supra 2 puffs every 4 hours as needed for cough, wheeze, dyspnea  -Do not use more than 12 puffs in 24 hours  - Rinse mouth after use   Follow up: 12 months   Thank you so much for letting me partake in your care today.  Don't hesitate to reach out if you have any additional concerns!  Hargis Springer, MD  [facility] [place], [place]

## 2024-05-02 NOTE — Progress Notes (Unsigned)
 FOLLOW UP Date of Service/Encounter:  05/04/24  Subjective:  Kaitlyn Terry (DOB: [birth year]) is a 63 y.o. female who [facility] in re-evaluation of the following: Allergic rhinitis, nut allergy , reactive airway disease History obtained from: chart review and patient.  For Review, LV was on 10/26/2023 with Dr. Lorin seen for routine follow-up. See below for summary of history and diagnostics.  ----------------------------------------------------- Pertinent History/Diagnostics:  Reactive Airway Disease: Use of Airsupra  for cough. Improvement noted with use; triggered by URI  - no recurrent symptoms  Allergic Rhinitis:  Frequent sinus infections requiring antibiotics and steroids. Persistent symptoms including runny nose, headaches, facial pain, and nasal congestion. Symptoms worsen with dust exposure and temperature changes. Previous allergy  shots in 2007-2008. - SPT environmental panel (04/02/23): grass, weeds, trees, mold, dust mite, dog, mixed feathers, roach - AIT started 12/25/23: Vial 1 (G-W-T-D), Vial 2 (M-DM-R ) Food Allergy :  Reports of vomiting after ingestion of nuts and aerosolized symptoms with exposure to peanut oil. Avoidance of all nuts. - SPT select foods 04/01/23: Positive to peanut, pecan, hazelnut ---------------------------- Today presents for follow-up. Discussed the use of AI scribe software for clinical note transcription with the patient, who gave verbal consent to proceed.  History of Present Illness Kaitlyn Terry is a 63 year old female who presents for follow-up on allergy  shots.  Allergen immunotherapy reactions - Receiving allergy  shots with minor local reactions, such as small bumps at the injection site - Reactions do not significantly interfere with daily activities - Most recent allergy  shots administered approximately 1.5 hours prior to visit - Previous course of allergy  shots completed for about one  year prior to 2010  Respiratory symptoms and allergen avoidance - Breathing remains stable - No use of Air Supra inhaler - Strict avoidance of peanuts and tree nuts, including pecan pie - No accidental exposures or allergic reactions  Medication use for allergic symptoms - Rare use of Dymista  nasal spray, unable to recall last use - Currently taking Zyrtec  Ear symptoms - Required ear irrigation three times in October due to cerumen impaction     All medications reviewed by clinical staff and updated in chart. No new pertinent medical or surgical history except as noted in HPI.  ROS: All others negative except as noted per HPI.   Objective:  BP 126/72 (BP Location: Left Arm)   Pulse 84   Resp 20   Wt 164 lb 11.2 oz (74.7 kg)   SpO2 96%   BMI 28.27 kg/m  Body mass index is 28.27 kg/m. Physical Exam: General Appearance:  Alert, cooperative, no distress, appears stated age  Head:  Normocephalic, without obvious abnormality, atraumatic  Eyes:  Conjunctiva clear, EOM's intact  Ears EACs normal bilaterally  Nose: Nares normal, erythematous nasal mucosa, no visible anterior polyps, and septum midline  Throat: Lips, tongue normal; teeth and gums normal, normal posterior oropharynx  Neck: Supple, symmetrical  Lungs:   clear to auscultation bilaterally, Respirations unlabored, no coughing  Heart:  regular rate and rhythm and no murmur, Appears well perfused  Extremities: No edema  Skin: Skin color, texture, turgor normal and no rashes or lesions on visualized portions of skin  Neurologic: No gross deficits   Labs:  Lab Orders  No laboratory test(s) ordered today   Spirometry:  Tracings reviewed. Her effort: It was hard to get consistent efforts and there is a question as to whether this reflects a maximal maneuver. FVC: 1.99L FEV1: 1.71L,  81% predicted FEV1/FVC ratio: 86% Interpretation: No overt abnormalities noted given today's efforts.  Please see scanned spirometry  results for details.   Assessment/Plan  Seasonal and perennial allergic rhinitis - Plan: Spirometry with Graph  Mild intermittent reactive airway disease without complication  Allergy  with anaphylaxis due to food   Patient Instructions  Recurrent Sinus Infections  Allergic Rhinitis    Allergy  test 04/01/23: Positive to grass, weeds, trees, mold, dust mite, dog, mixed feathers, roach  Plan:  - Continue Dymista  nasal spray: use 1-2 sprays in each nostril daily  - Consider daily antihistamine such as Zyrtec 10 mg  - Continue avoidance measures  - Continue allergy  injections per protocol and carry epipen  on injections day    Nut Allergy  -Allergy  test 04/01/23: Positive to peanut, pecan, hazelnut -Continue avoidance of peanuts and tree nuts -EpiPen  and food allergy  action plan provided today  Reactive Airway Disease: mild intermittent Use of Airsupra  for cough. Improvement noted with use; triggered by URI  -Continue to air supra 2 puffs every 4 hours as needed for cough, wheeze, dyspnea  -Do not use more than 12 puffs in 24 hours  - Rinse mouth after use   Follow up: 12 months   Thank you so much for letting me partake in your care today.  Don't hesitate to reach out if you have any additional concerns!  Hargis Springer, MD  [facility] [place], [place]  Other:    Thank you so much for letting me partake in your care today.  Don't hesitate to reach out if you have any additional concerns!  Hargis Springer, MD  [facility] [place], [place]

## 2024-05-04 MED ORDER — EPINEPHRINE 0.3 MG/0.3ML IJ SOAJ: 0.3000 mg | INTRAMUSCULAR | 1 refills | Status: AC | PRN

## 2024-05-09 ENCOUNTER — Other Ambulatory Visit: Payer: Self-pay

## 2024-05-10 ENCOUNTER — Ambulatory Visit

## 2024-05-10 ENCOUNTER — Other Ambulatory Visit

## 2024-05-10 DIAGNOSIS — J309 Allergic rhinitis, unspecified: Secondary | ICD-10-CM | POA: Diagnosis not present

## 2024-05-10 DIAGNOSIS — E039 Hypothyroidism, unspecified: Secondary | ICD-10-CM

## 2024-05-11 ENCOUNTER — Ambulatory Visit: Payer: Self-pay | Admitting: Internal Medicine

## 2024-05-11 LAB — TSH: TSH: 0.684 u[IU]/mL (ref 0.450–4.500)

## 2024-05-16 ENCOUNTER — Institutional Professional Consult (permissible substitution): Admitting: Neurology

## 2024-05-19 ENCOUNTER — Ambulatory Visit (INDEPENDENT_AMBULATORY_CARE_PROVIDER_SITE_OTHER): Admitting: *Deleted

## 2024-05-19 DIAGNOSIS — J309 Allergic rhinitis, unspecified: Secondary | ICD-10-CM | POA: Diagnosis not present

## 2024-05-20 ENCOUNTER — Ambulatory Visit (INDEPENDENT_AMBULATORY_CARE_PROVIDER_SITE_OTHER): Admitting: Audiology

## 2024-05-20 DIAGNOSIS — Z0110 Encounter for examination of ears and hearing without abnormal findings: Secondary | ICD-10-CM | POA: Diagnosis not present

## 2024-05-20 DIAGNOSIS — H93291 Other abnormal auditory perceptions, right ear: Secondary | ICD-10-CM

## 2024-05-20 NOTE — Progress Notes (Signed)
°  [address] [place], [place] [address] [phone]  Audiological Evaluation    Name: Kaitlyn Terry     DOB:   1961-02-17      MRN:   [id number]                                                                                     Service Date: 05/20/2024     Accompanied by: self    Patient comes today after Reyes Cohen, PA-C sent a referral for a hearing evaluation due to concerns with ear pain.   Symptoms Yes Details  Hearing loss  []    Tinnitus  []    Ear pain/ infections/pressure  [x]  Reports has issues with wax accumulation. Today doe snot report pain , but does report a cloggy sensation in the right ear.  Balance problems  []    Noise exposure history  []    Previous ear surgeries  []    Family history of hearing loss  []    Amplification  []    Other  []      Otoscopy: Right ear: Clear external ear canal and notable landmarks visualized on the tympanic membrane. Left ear:  Clear external ear canal and notable landmarks visualized on the tympanic membrane.  Tympanometry: Right ear: Type A - Normal external ear canal volume with normal middle ear pressure and normal tympanic membrane compliance. Findings are consistent with normal middle ear function. Left ear: Type A - Normal external ear canal volume with normal middle ear pressure and normal tympanic membrane compliance. Findings are consistent with normal middle ear function.  Hearing Evaluation The hearing test results were completed under headphones and results are deemed to be of good reliability. Test technique:  conventional    Pure tone Audiometry: Both ears- Normal hearing from [phone]. SABRA  Speech Audiometry: Right ear- Speech Reception Threshold (SRT) was obtained at 5 dBHL. Left ear-Speech Reception Threshold (SRT) was obtained at 5 dBHL.   Word Recognition Score Tested using NU-6 (recorded) Right ear: 100% was obtained at a presentation level of 50 dBHL with contralateral masking which  is deemed as  excellent. Left ear: 100% was obtained at a presentation level of 50 dBHL with contralateral masking which is deemed as  excellent.   Impression: There is not a significant difference in pure-tone thresholds between ears., There is not a significant difference in the word recognition score in between ears.    Recommendations: Follow up with ENT as per MD. Return for a hearing evaluation if concerns with hearing changes arise or per MD recommendation.   Jacqulyn Barresi MARIE LEROUX-MARTINEZ, AUD

## 2024-05-27 ENCOUNTER — Ambulatory Visit (INDEPENDENT_AMBULATORY_CARE_PROVIDER_SITE_OTHER)

## 2024-05-27 VITALS — BP 120/74 | HR 96 | Resp 20

## 2024-05-27 DIAGNOSIS — J309 Allergic rhinitis, unspecified: Secondary | ICD-10-CM | POA: Diagnosis not present

## 2024-05-27 NOTE — Progress Notes (Signed)
 Patient came back about an hour after her shot reporting congestion and a heaviness in the chest area.  Vital signs were normal at 130/78 pulse 86, oxygen 100 on first reading.  Additional readings were normal including 120/74 blood pressure 96 pulse rate of 96% oxygen level. Exam she had clear lung fields, regular rate and rhythm heart.  No evidence of airway obstruction. She was treated with Zyrtec 10 mg and Pepcid 40 mg.  She was observed.  She did report lessening nasal congestion symptoms.  However she still felt some slight discomfort in the chest thus I did give her 2 puffs of levalbuterol which did seem to relieve this.  After the levalbuterol she did have a good cough and after that she felt like her chest symptoms improved.  Her symptoms did not amount to needing use of epinephrine .  She was able to discharge home in good condition.  She did have her epinephrine  device on her.  I advised her to take additional Allegra dosing this evening and use her nasal sprays at home.  This was the start of her red vial.  Would back her back down to the 1:10 (yellow vial) at 0.4 and increased back up for her schedule.

## 2024-05-31 ENCOUNTER — Telehealth (INDEPENDENT_AMBULATORY_CARE_PROVIDER_SITE_OTHER): Payer: Self-pay

## 2024-05-31 NOTE — Telephone Encounter (Signed)
 Called patient and left voicemail regarding hearing test. Asked patient to give us  a call back.

## 2024-06-06 ENCOUNTER — Ambulatory Visit

## 2024-06-06 DIAGNOSIS — J309 Allergic rhinitis, unspecified: Secondary | ICD-10-CM

## 2024-06-08 ENCOUNTER — Encounter: Payer: Self-pay | Admitting: Neurology

## 2024-06-08 ENCOUNTER — Ambulatory Visit: Admitting: Neurology

## 2024-06-08 VITALS — BP 128/80 | HR 68 | Ht 64.0 in | Wt 166.6 lb

## 2024-06-08 DIAGNOSIS — E663 Overweight: Secondary | ICD-10-CM | POA: Diagnosis not present

## 2024-06-08 DIAGNOSIS — Z820 Family history of epilepsy and other diseases of the nervous system: Secondary | ICD-10-CM

## 2024-06-08 DIAGNOSIS — G4719 Other hypersomnia: Secondary | ICD-10-CM

## 2024-06-08 DIAGNOSIS — Z9189 Other specified personal risk factors, not elsewhere classified: Secondary | ICD-10-CM | POA: Diagnosis not present

## 2024-06-08 DIAGNOSIS — R0683 Snoring: Secondary | ICD-10-CM | POA: Diagnosis not present

## 2024-06-08 DIAGNOSIS — G4700 Insomnia, unspecified: Secondary | ICD-10-CM | POA: Diagnosis not present

## 2024-06-08 NOTE — Progress Notes (Signed)
 Subjective:    Patient ID: Kaitlyn Terry is a 63 y.o. female.  HPI    True Mar, MD, PhD [facility] [address], [address] [address] [place], [place] [address]  Dear Dr. Jarold,   I saw your patient, Kaitlyn Terry, upon your kind request in my sleep clinic today for initial consultation of her sleep disorder, in particular, concern for underlying obstructive sleep apnea.  The patient is unaccompanied today.  As you know, Kaitlyn Terry is a 63 year old female with an underlying medical history of thyroid  disease, hypertension, allergic rhinitis, and overweight state, who reports snoring and nonrestorative sleep, some daytime tiredness and difficulty initiating and maintaining sleep.  Her Epworth sleepiness score is 3 out of 24, fatigue severity score is 21 out of 63.  She does not wake up rested.  I reviewed your office note from 03/31/2024.  She attributes her sleep difficulty primarily to stress, mostly work-related stress.  She works as an production designer, theatre/television/film for [employer].  She has worked as an programmer, systems.  She is married and lives with her husband.  She has 1 biological grown child and he has 4 grown children.  They have 4 grandchildren.  They have no pets in the household.  She tries to go to bed between 9:30 PM and 9:45 PM and rise time is around 6.  She has taken melatonin occasionally, 2 chewable pills and has found melatonin helpful.  She takes it maybe twice a month.  She drinks no daily caffeine.  She does not drink any alcohol.  She is a non-smoker.  Snoring can be loud.  Her sister has sleep apnea.  She mentioned brain fog symptoms to you in October 2025 but symptoms have since then improved.  She has seen ENT and had cerumen impaction and this has since then been addressed and she feels better including with her equilibrium.  Her Past Medical History Is Significant For: Past Medical History:  Diagnosis Date   Acute non-recurrent maxillary sinusitis  04/03/2022   Breast density    Hypertension    Pelvic pain    Thyroid  disease    Yeast vaginitis     Her Past Surgical History Is Significant For: Past Surgical History:  Procedure Laterality Date   ABDOMINAL HYSTERECTOMY     ULNAR NERVE DECOMPRESSION Right 12/04/2023    Her Family History Is Significant For: Family History  Problem Relation Age of Onset   COPD Mother    Asthma Mother    Cancer Father    Allergies Father    Sleep apnea Sister    Migraines Sister    Seizures Neg Hx    Stroke Neg Hx     Her Social History Is Significant For: Social History   Socioeconomic History   Marital status: Married    Spouse name: Not on file   Number of children: 1   Years of education: Not on file   Highest education level: Doctorate  Occupational History   Occupation: [employer]  Tobacco Use   Smoking status: Never   Smokeless tobacco: Never  Vaping Use   Vaping status: Never Used  Substance and Sexual Activity   Alcohol use: No   Drug use: No   Sexual activity: Not on file    Comment: hysterectomy   Other Topics Concern   Not on file  Social History Narrative   Some caffeine weekly (3 times a week Sweet tea)   Social Drivers of  Health   Tobacco Use: Low Risk (06/08/2024)   Patient History    Smoking Tobacco Use: Never    Smokeless Tobacco Use: Never    Passive Exposure: Not on file  Financial Resource Strain: Low Risk (09/20/2023)   Overall Financial Resource Strain (CARDIA)    Difficulty of Paying Living Expenses: Not hard at all  Food Insecurity: No Food Insecurity (09/20/2023)   Hunger Vital Sign    Worried About Running Out of Food in the Last Year: Never true    Ran Out of Food in the Last Year: Never true  Transportation Needs: No Transportation Needs (09/20/2023)   PRAPARE - Administrator, Civil Service (Medical): No    Lack of Transportation (Non-Medical): No  Physical Activity: Insufficiently Active (09/14/2022)    Exercise Vital Sign    Days of Exercise per Week: 2 days    Minutes of Exercise per Session: 20 min  Stress: No Stress Concern Present (09/14/2022)   [employer] of Occupational Health - Occupational Stress Questionnaire    Feeling of Stress : Not at all  Social Connections: Socially Integrated (09/20/2023)   Social Connection and Isolation Panel    Frequency of Communication with Friends and Family: Three times a week    Frequency of Social Gatherings with Friends and Family: Three times a week    Attends Religious Services: 1 to 4 times per year    Active Member of Clubs or Organizations: Yes    Attends Banker Meetings: 1 to 4 times per year    Marital Status: Married  Depression (PHQ2-9): Low Risk (03/31/2024)   Depression (PHQ2-9)    PHQ-2 Score: 0  Alcohol Screen: Low Risk (09/20/2023)   Alcohol Screen    Last Alcohol Screening Score (AUDIT): 1  Housing: Low Risk (09/20/2023)   Housing Stability Vital Sign    Unable to Pay for Housing in the Last Year: No    Number of Times Moved in the Last Year: 0    Homeless in the Last Year: No  Utilities: Not on file  Health Literacy: Not on file    Her Allergies Are:  Allergies[1]:   Her Current Medications Are:  Outpatient Encounter Medications as of 06/08/2024  Medication Sig   Albuterol -Budesonide (AIRSUPRA ) 90-80 MCG/ACT AERO Inhale 90 mcg into the lungs 2 (two) times daily as needed.   amLODipine  (NORVASC ) 5 MG tablet Take 1 tablet (5 mg total) by mouth daily.   Azelastine -Fluticasone  137-50 MCG/ACT SUSP Place 1 spray into the nose in the morning and at bedtime. (Patient taking differently: Place 1 spray into the nose in the morning and at bedtime. As needed)   Chlorphen-PE-Acetaminophen (NOREL AD) 4-10-325 MG TABS One tab po twice daily prn   cyclobenzaprine  (FLEXERIL ) 10 MG tablet One tab po qpm prn   EPINEPHrine  0.3 mg/0.3 mL IJ SOAJ injection Inject 0.3 mg into the muscle as needed for anaphylaxis.    SYNTHROID  88 MCG tablet Take one tab daily, skip SUNDAYS   fluticasone  (FLONASE ) 50 MCG/ACT nasal spray Place 1 spray into both nostrils daily. (Patient not taking: Reported on 06/08/2024)   montelukast  (SINGULAIR ) 10 MG tablet Take 1 tablet (10 mg total) by mouth at bedtime. (Patient not taking: Reported on 06/08/2024)   Vitamin D , Ergocalciferol , (DRISDOL ) 1.25 MG (50000 UNIT) CAPS capsule TAKE 1 CAPSULE BY ORAL ROUTE once weekly (Patient not taking: Reported on 06/08/2024)   No facility-administered encounter medications on file as of 06/08/2024.  :  Review of Systems:  Out of a complete 14 point review of systems, all are reviewed and negative with the exception of these symptoms as listed below:  Review of Systems  Objective:  Neurological Exam  Physical Exam Physical Examination:   Vitals:   06/08/24 1119  BP: 128/80  Pulse: 68  SpO2: 99%    General Examination: The patient is a very pleasant 63 y.o. female in no acute distress. She appears well-developed and well-nourished and well groomed.   HEENT: Normocephalic, atraumatic, pupils are equal, round and reactive to light, extraocular tracking is good without limitation to gaze excursion or nystagmus noted. No photophobia. Hearing is grossly intact.  Face is symmetric with normal facial animation. Speech is clear without dysarthria. There is no hypophonia. There is no lip, neck/head, jaw or voice tremor. Neck is supple with full range of passive and active motion. There are no carotid bruits on auscultation.  Airway/Oropharynx exam reveals: No significant mouth dryness, good dental hygiene, mild airway crowding secondary to small airway but otherwise benign findings, neck circumference 14 inches, small tonsils noted.  Tongue protrudes centrally and palate elevates symmetrically.  No significant overbite.   Chest: Clear to auscultation without wheezing, rhonchi or crackles noted.  Heart: S1+S2+0, regular and normal without  murmurs, rubs or gallops noted.   Abdomen: Soft, non-tender and non-distended.  Extremities: There is no pitting edema in the distal lower extremities bilaterally.   Skin: Warm and dry without trophic changes noted.   Musculoskeletal: exam reveals no obvious joint deformities.   Neurologically:  Mental status: The patient is awake, alert and oriented in all 4 spheres. Her immediate and remote memory, attention, language skills and fund of knowledge are appropriate. There is no evidence of aphasia, agnosia, apraxia or anomia. Speech is clear with normal prosody and enunciation. Thought process is linear. Mood is normal and affect is normal.  Cranial nerves II - XII are as described above under HEENT exam.  Motor exam: Normal bulk, moving all 4 extremities without restriction, no obvious action or resting tremor.  Fine motor skills and coordination: Intact grossly.  Cerebellar testing: No dysmetria or intention tremor. There is no truncal or gait ataxia.  Sensory exam: intact to light touch in the upper and lower extremities.  Gait, station and balance: She stands easily. No veering to one side is noted. No leaning to one side is noted. Posture is age-appropriate and stance is narrow based. Gait shows normal stride length and normal pace. No problems turning are noted.   Assessment and Plan:   In summary, RONICA VIVIAN is a very pleasant 63 year old female with an underlying medical history of thyroid  disease, hypertension, allergic rhinitis, and overweight state, whose history and physical exam are concerning for sleep disordered breathing, particularly obstructive sleep apnea (OSA). A laboratory attended sleep study is typically considered gold standard for evaluation of sleep disordered breathing.   I had a long chat with the patient about my findings and the diagnosis of sleep apnea, particularly OSA, its prognosis and treatment options. We talked about medical/conservative treatments,  surgical interventions and non-pharmacological approaches for symptom control. I explained, in particular, the risks and ramifications of untreated moderate to severe OSA, especially with respect to developing cardiovascular disease down the road, including congestive heart failure (CHF), difficult to treat hypertension, cardiac arrhythmias (particularly A-fib), neurovascular complications including TIA, stroke and dementia. Even type 2 diabetes has, in part, been linked to untreated OSA. Symptoms of untreated OSA may include (but may  not be limited to) daytime sleepiness, nocturia (i.e. frequent nighttime urination), memory problems, mood irritability and suboptimally controlled or worsening mood disorder such as depression and/or anxiety, lack of energy, lack of motivation, physical discomfort, as well as recurrent headaches, especially morning or nocturnal headaches. We talked about the importance of maintaining a healthy lifestyle and striving for healthy weight.  I recommended a sleep study at this time. I outlined the differences between a laboratory attended sleep study which is considered more comprehensive and accurate over the option of a home sleep test (HST); the latter may lead to underestimation of sleep disordered breathing in some instances and does not help with diagnosing upper airway resistance syndrome and is not accurate enough to diagnose primary central sleep apnea typically. I outlined possible surgical and non-surgical treatment options of OSA, including the use of a positive airway pressure (PAP) device (i.e. CPAP, AutoPAP/APAP or BiPAP in certain circumstances), a custom-made dental device (aka oral appliance, which would require a referral to a specialist dentist or orthodontist typically, and is generally speaking not considered for patients with full dentures or edentulous state), upper airway surgical options, such as traditional UPPP (which is not considered a first-line treatment)  or the Inspire device (hypoglossal nerve stimulator, which would involve a referral for consultation with an ENT surgeon, after careful selection, following inclusion criteria - also not first-line treatment). I explained the PAP treatment option to the patient in detail, as this is generally considered first-line treatment.  The patient indicated that she would be willing to try PAP therapy, if the need arises.  We will pick up our discussion about the next steps and treatment options after testing.  We will keep her posted as to the test results by phone call and/or MyChart messaging where possible.  We will plan to follow-up in sleep clinic accordingly as well.  I answered all her questions today and the patient was in agreement.   I encouraged her to call with any interim questions, concerns, problems or updates or email us  through MyChart.  Generally speaking, sleep test authorizations may take up to 2 weeks, sometimes less, sometimes longer, the patient is encouraged to get in touch with us  if they do not hear back from the sleep lab staff directly within the next 2 weeks.  Thank you very much for allowing me to participate in the care of this nice patient. If I can be of any further assistance to you please do not hesitate to call me at [phone].  Sincerely,   True Mar, MD, PhD      [1]  Allergies Allergen Reactions   Peanut Oil Other (See Comments)    Positive testing in the past. Has had vomiting.  peanut oil

## 2024-06-08 NOTE — Patient Instructions (Signed)

## 2024-06-13 ENCOUNTER — Ambulatory Visit (INDEPENDENT_AMBULATORY_CARE_PROVIDER_SITE_OTHER): Admitting: Physician Assistant

## 2024-06-13 ENCOUNTER — Encounter (INDEPENDENT_AMBULATORY_CARE_PROVIDER_SITE_OTHER): Payer: Self-pay | Admitting: Physician Assistant

## 2024-06-13 VITALS — BP 125/79 | HR 85

## 2024-06-13 DIAGNOSIS — H6120 Impacted cerumen, unspecified ear: Secondary | ICD-10-CM

## 2024-06-14 NOTE — Progress Notes (Signed)
 Dear Dr. Jarold, Here is my assessment for our mutual patient, Kaitlyn Terry. Thank you for allowing me the opportunity to care for your patient. Please do not hesitate to contact me should you have any other questions. Sincerely, Chyrl Cohen PA-C  Otolaryngology Clinic Note Referring provider: Dr. Jarold HPI:  Kaitlyn Terry is a 64 y.o. female kindly referred by Dr. Jarold   Discussed the use of AI scribe software for clinical note transcription with the patient, who gave verbal consent to proceed.  History of Present Illness   Kaitlyn Terry is a 64 year old female with recurrent cerumen impaction who presents with ear fullness and decreased hearing.  She reports a persistent sensation of ear fullness and decreased hearing, primarily unilateral, described as glassy or cloudy and not at her baseline. She experiences discomfort in the affected ear, especially during attempts at cerumen removal, which she finds loud and uncomfortable. She denies significant otalgia but expresses frustration with the ongoing fullness.  Symptoms began four days ago and have remained stable throughout the day. She used Debrox ear drops with temporary relief, but symptoms recurred, leading to repeated use. She has previously required ear irrigation for cerumen removal and is concerned about recurrence.  She denies acute worsening of hearing loss, otorrhea, vertigo, or systemic symptoms. She recently underwent a hearing evaluation, which was reportedly normal compared to prior testing.           Independent Review of Additional Tests or Records:  Previous office visit note 04/12/2024    PMH/Meds/All/SocHx/FamHx/ROS:   Past Medical History:  Diagnosis Date   Acute non-recurrent maxillary sinusitis 04/03/2022   Breast density    Hypertension    Pelvic pain    Thyroid  disease    Yeast vaginitis      Past Surgical History:  Procedure Laterality Date   ABDOMINAL HYSTERECTOMY     ULNAR NERVE  DECOMPRESSION Right 12/04/2023    Family History  Problem Relation Age of Onset   COPD Mother    Asthma Mother    Cancer Father    Allergies Father    Sleep apnea Sister    Migraines Sister    Seizures Neg Hx    Stroke Neg Hx      Social Connections: Socially Integrated (09/20/2023)   Social Connection and Isolation Panel    Frequency of Communication with Friends and Family: Three times a week    Frequency of Social Gatherings with Friends and Family: Three times a week    Attends Religious Services: 1 to 4 times per year    Active Member of Clubs or Organizations: Yes    Attends Banker Meetings: 1 to 4 times per year    Marital Status: Married     Current Medications[1]   Physical Exam:   BP 125/79   Pulse 85   SpO2 94%   Pertinent Findings  CN II-XII grossly intact Bilateral EAC with minimal cerumen  and TM intact with well pneumatized middle ear spaces No obviously palpable neck masses/lymphadenopathy/thyromegaly No respiratory distress or stridor   Seprately Identifiable Procedures:  None  Impression & Plans:  Kaitlyn Terry is a 64 y.o. female with the following   Assessment and Plan    Evaluation for cerumen- Minimal cerumen today.  Normal audiometry and tympanometry previosly exclude significant hearing loss or tympanic membrane dysfunction. - Advised current cerumen quantity insufficient for obstruction or conductive hearing loss. - Discussed optional use of Debrox drops, unlikely to resolve symptoms due to  minimal cerumen. - Recommended monitoring symptoms and returning if hearing declines or symptoms persist. - Advised against self-removal of cerumen; recommended professional removal for future significant accumulation.. - Reinforced returning for specialist care if primary care provider recommends cerumen removal, avoiding water irrigation.           - f/u prn   Thank you for allowing me the opportunity to care for your patient.  Please do not hesitate to contact me should you have any other questions.  Sincerely, Chyrl Cohen PA-C [facility] Phone: [phone] Fax: [phone]  06/14/2024, 11:52 AM        [1]  Current Outpatient Medications:    Albuterol -Budesonide (AIRSUPRA ) 90-80 MCG/ACT AERO, Inhale 90 mcg into the lungs 2 (two) times daily as needed., Disp: 10 g, Rfl: 1   amLODipine  (NORVASC ) 5 MG tablet, Take 1 tablet (5 mg total) by mouth daily., Disp: 90 tablet, Rfl: 1   Azelastine -Fluticasone  137-50 MCG/ACT SUSP, Place 1 spray into the nose in the morning and at bedtime. (Patient taking differently: Place 1 spray into the nose in the morning and at bedtime. As needed), Disp: 23 g, Rfl: 5   Chlorphen-PE-Acetaminophen (NOREL AD) 4-10-325 MG TABS, One tab po twice daily prn, Disp: 20 tablet, Rfl: 0   cyclobenzaprine  (FLEXERIL ) 10 MG tablet, One tab po qpm prn, Disp: 30 tablet, Rfl: 0   EPINEPHrine  0.3 mg/0.3 mL IJ SOAJ injection, Inject 0.3 mg into the muscle as needed for anaphylaxis., Disp: 1 each, Rfl: 1   SYNTHROID  88 MCG tablet, Take one tab daily, skip SUNDAYS, Disp: 90 tablet, Rfl: 2   fluticasone  (FLONASE ) 50 MCG/ACT nasal spray, Place 1 spray into both nostrils daily. (Patient not taking: Reported on 06/08/2024), Disp: 15.8 g, Rfl: 1   montelukast  (SINGULAIR ) 10 MG tablet, Take 1 tablet (10 mg total) by mouth at bedtime. (Patient not taking: Reported on 06/08/2024), Disp: 90 tablet, Rfl: 1   Vitamin D , Ergocalciferol , (DRISDOL ) 1.25 MG (50000 UNIT) CAPS capsule, TAKE 1 CAPSULE BY ORAL ROUTE once weekly (Patient not taking: Reported on 06/08/2024), Disp: 12 capsule, Rfl: 1

## 2024-06-16 ENCOUNTER — Ambulatory Visit (INDEPENDENT_AMBULATORY_CARE_PROVIDER_SITE_OTHER)

## 2024-06-16 DIAGNOSIS — J302 Other seasonal allergic rhinitis: Secondary | ICD-10-CM | POA: Diagnosis not present

## 2024-06-23 ENCOUNTER — Ambulatory Visit

## 2024-06-23 DIAGNOSIS — J302 Other seasonal allergic rhinitis: Secondary | ICD-10-CM

## 2024-06-30 ENCOUNTER — Ambulatory Visit

## 2024-06-30 DIAGNOSIS — J302 Other seasonal allergic rhinitis: Secondary | ICD-10-CM | POA: Diagnosis not present

## 2024-07-15 ENCOUNTER — Ambulatory Visit: Admitting: *Deleted

## 2024-07-15 DIAGNOSIS — J302 Other seasonal allergic rhinitis: Secondary | ICD-10-CM

## 2024-08-11 ENCOUNTER — Encounter: Payer: Self-pay | Admitting: Internal Medicine
